# Patient Record
Sex: Male | Born: 1980 | Race: White | Hispanic: No | Marital: Married | Smoking: Never smoker
Health system: Southern US, Community
[De-identification: ages and names within clinical notes are randomized; demographics above are authoritative.]

## PROBLEM LIST (undated history)

## (undated) DIAGNOSIS — G4730 Sleep apnea, unspecified: Secondary | ICD-10-CM

## (undated) HISTORY — DX: Sleep apnea, unspecified: G47.30

---

## 2009-01-27 ENCOUNTER — Emergency Department: Payer: Self-pay | Admitting: Emergency Medicine

## 2012-04-11 ENCOUNTER — Ambulatory Visit: Payer: Self-pay | Admitting: Medical

## 2016-08-11 DIAGNOSIS — Z008 Encounter for other general examination: Secondary | ICD-10-CM | POA: Diagnosis not present

## 2016-08-12 DIAGNOSIS — Z0279 Encounter for issue of other medical certificate: Secondary | ICD-10-CM | POA: Diagnosis not present

## 2016-08-12 DIAGNOSIS — Z0000 Encounter for general adult medical examination without abnormal findings: Secondary | ICD-10-CM | POA: Diagnosis not present

## 2016-08-12 DIAGNOSIS — Z125 Encounter for screening for malignant neoplasm of prostate: Secondary | ICD-10-CM | POA: Diagnosis not present

## 2016-08-12 DIAGNOSIS — Z008 Encounter for other general examination: Secondary | ICD-10-CM | POA: Diagnosis not present

## 2017-01-15 ENCOUNTER — Ambulatory Visit (INDEPENDENT_AMBULATORY_CARE_PROVIDER_SITE_OTHER): Payer: 59

## 2017-01-15 ENCOUNTER — Ambulatory Visit
Admission: EM | Admit: 2017-01-15 | Discharge: 2017-01-15 | Disposition: A | Discharge status: Home / Self Care | Payer: 59 | Attending: Family Medicine | Admitting: Family Medicine

## 2017-01-15 DIAGNOSIS — S93412A Sprain of calcaneofibular ligament of left ankle, initial encounter: Secondary | ICD-10-CM

## 2017-01-15 DIAGNOSIS — W1842XA Slipping, tripping and stumbling without falling due to stepping into hole or opening, initial encounter: Secondary | ICD-10-CM

## 2017-01-15 DIAGNOSIS — M25572 Pain in left ankle and joints of left foot: Secondary | ICD-10-CM | POA: Diagnosis not present

## 2017-01-15 DIAGNOSIS — S99912A Unspecified injury of left ankle, initial encounter: Secondary | ICD-10-CM | POA: Diagnosis not present

## 2017-01-15 NOTE — ED Triage Notes (Signed)
Pt reports twisting his left ankle on Wednesday while stepping off a boat onto another. Pain 3/10. Wearing a brace in triage.

## 2017-01-15 NOTE — ED Provider Notes (Signed)
[facility]    CSN: [id number] Arrival date & time: 01/15/17  1156     History   Chief Complaint Chief Complaint  Patient presents with  . Ankle Pain    HPI Alan Duncan is a 36 y.o. male.   HPI   This a 36 year old male while on a cruise in the [place]. On Wednesday 2 days prior to this visit he was stepping into a small launch when he twisted his ankle and felt a pop. He states his left ankle twisted into inversion. Walked on it throughout the rest of the day on the cruise boat and then later in [place] before coming home. Since that time it has had swelling with ecchymosis over the inferior portion of the lateral malleolus. He purchased a brace which is helped with the discomfort with ambulation.        History reviewed. No pertinent past medical history.  There are no active problems to display for this patient.   History reviewed. No pertinent surgical history.     Home Medications    Prior to Admission medications   Not on File    Family History History reviewed. No pertinent family history.  Social History Social History  Substance Use Topics  . Smoking status: Never Smoker  . Smokeless tobacco: Never Used  . Alcohol use Yes     Comment: social     Allergies   Patient has no known allergies.   Review of Systems Review of Systems  Constitutional: Positive for activity change. Negative for appetite change, chills, fatigue and fever.  Musculoskeletal: Positive for gait problem and joint swelling.  All other systems reviewed and are negative.    Physical Exam Triage Vital Signs ED Triage Vitals [01/15/17 1211]  Enc Vitals Group     BP      Pulse      Resp      Temp      Temp src      SpO2      Weight (!) 310 lb (140.6 kg)     Height  (1.727 m)     Head Circumference      Peak Flow      Pain Score 3     Pain Loc      Pain Edu?      Excl. in GC?    No data found.   Updated Vital  Signs Ht  (1.727 m)   Wt (!) 310 lb (140.6 kg)   BMI 47.14 kg/m   Visual Acuity Right Eye Distance:   Left Eye Distance:   Bilateral Distance:    Right Eye Near:   Left Eye Near:    Bilateral Near:     Physical Exam  Constitutional: He is oriented to person, place, and time. He appears well-developed and well-nourished. No distress.  HENT:  Head: Normocephalic.  Eyes: Pupils are equal, round, and reactive to light.  Neck: Normal range of motion.  Musculoskeletal: Normal range of motion. He exhibits edema and tenderness.  Examination of the left ankle shows swelling maximally over the lateral aspect inferior to the malleolus. Inferior to the malleolus around the calcaneus is at area of ecchymosis. This is from extravasation. He has fairly good range of motion plantar flexion dorsiflexion and subtalar motion. Forced inversion  tends to hurt inferior to the lateral malleolus. Maximal tenderness is over the fibulocalcaneal ligament. He has  mild tenderness at the distal tip of the lateral malleolus.  He ambulates with a mild antalgic gait on the left.  Neurological: He is alert and oriented to person, place, and time.  Skin: Skin is warm and dry. He is not diaphoretic. There is erythema.  Psychiatric: He has a normal mood and affect. His behavior is normal. Judgment and thought content normal.  Nursing note and vitals reviewed.    UC Treatments / Results  Labs (all labs ordered are listed, but only abnormal results are displayed) Labs Reviewed - No data to display  EKG  EKG Interpretation None       Radiology Dg Ankle Complete Left  Result Date: 01/15/2017 CLINICAL DATA:  Acute left ankle pain following twisting injury 2 days ago. Initial encounter. EXAM: LEFT ANKLE COMPLETE - 3+ VIEW COMPARISON:  None. FINDINGS: There is no evidence of fracture, dislocation, or joint effusion. There is no evidence of arthropathy or other focal bone abnormality. Soft tissues are  unremarkable. IMPRESSION: Negative. Electronically Signed   By: Harmon Pier M.D.   On: 01/15/2017 12:38    Procedures Procedures (including critical care time)  Medications Ordered in UC Medications - No data to display   Initial Impression / Assessment and Plan / UC Course  I have reviewed the triage vital signs and the nursing notes.  Pertinent labs & imaging results that were available during my care of the patient were reviewed by me and considered in my medical decision making (see chart for details).    Plan: 1. Test/x-ray results and diagnosis reviewed with patient 2. rx as per orders; risks, benefits, potential side effects reviewed with patient 3. Recommend supportive treatment with elevation above the heart to control swelling. Use ice 20 minutes out of every 2 hours for 4- 5 times daily for swelling control and pain control. Perform range of motion exercises of the ankle while healing. If you're not improving follow-up with Dr. Ether Griffins podiatrist. Use Motrin or Tylenol for pain control. 4. F/u prn if symptoms worsen or don't improve     Final Clinical Impressions(s) / UC Diagnoses   Final diagnoses:  Sprain of calcaneofibular ligament of left ankle, initial encounter    New Prescriptions There are no discharge medications for this patient.    Controlled Substance Prescriptions [place] Controlled Substance Registry consulted? Not Applicable   Lutricia Feil, PA-C 01/15/17 1415

## 2017-01-15 NOTE — Discharge Instructions (Signed)
Elevated above your heart to control swelling. Use ice 20 minutes out of every 2 hours 4-5 times daily.

## 2017-05-14 ENCOUNTER — Ambulatory Visit
Admission: EM | Admit: 2017-05-14 | Discharge: 2017-05-14 | Disposition: A | Discharge status: Home / Self Care | Payer: 59 | Attending: Family Medicine | Admitting: Family Medicine

## 2017-05-14 ENCOUNTER — Other Ambulatory Visit: Payer: Self-pay

## 2017-05-14 ENCOUNTER — Encounter: Payer: Self-pay | Admitting: *Deleted

## 2017-05-14 DIAGNOSIS — H8111 Benign paroxysmal vertigo, right ear: Secondary | ICD-10-CM

## 2017-05-14 NOTE — ED Provider Notes (Signed)
[facility]    CSN: [id number] Arrival date & time: 05/14/17  1129     History   Chief Complaint Chief Complaint  Patient presents with  . Dizziness    HPI LUZ BURCHER is a 37 y.o. male.   The history is provided by the patient.  Dizziness  Quality:  Vertigo Severity:  Moderate Onset quality:  Sudden Duration:  12 months Timing:  Intermittent Progression:  Unchanged Chronicity:  Chronic Context: head movement   Relieved by:  None tried Ineffective treatments:  None tried Associated symptoms: no blood in stool, no chest pain, no diarrhea, no headaches, no hearing loss, no nausea, no palpitations, no shortness of breath, no syncope, no tinnitus, no vision changes, no vomiting and no weakness   Risk factors: no anemia, no heart disease, no hx of stroke, no hx of vertigo, no Meniere's disease, no multiple medications and no new medications     History reviewed. No pertinent past medical history.  There are no active problems to display for this patient.   History reviewed. No pertinent surgical history.     Home Medications    Prior to Admission medications   Not on File    Family History Family History  Problem Relation Age of Onset  . Other Mother     Social History Social History   Tobacco Use  . Smoking status: Never Smoker  . Smokeless tobacco: Never Used  Substance Use Topics  . Alcohol use: Yes    Comment: social  . Drug use: No     Allergies   Patient has no known allergies.   Review of Systems Review of Systems  HENT: Negative for hearing loss and tinnitus.   Respiratory: Negative for shortness of breath.   Cardiovascular: Negative for chest pain, palpitations and syncope.  Gastrointestinal: Negative for blood in stool, diarrhea, nausea and vomiting.  Neurological: Positive for dizziness. Negative for weakness and headaches.     Physical Exam Triage Vital Signs ED Triage Vitals  Enc Vitals Group     BP  05/14/17 1138 (!) 149/84     Pulse Rate 05/14/17 1138 61     Resp 05/14/17 1138 16     Temp 05/14/17 1138 97.9 F (36.6 C)     Temp Source 05/14/17 1138 Oral     SpO2 05/14/17 1138 97 %     Weight 05/14/17 1139 (!) 315 lb (142.9 kg)     Height 05/14/17 1139 5\' 8"  (1.727 m)     Head Circumference --      Peak Flow --      Pain Score 05/14/17 1139 0     Pain Loc --      Pain Edu? --      Excl. in GC? --    No data found.  Updated Vital Signs BP (!) 149/84 (BP Location: Right Arm)   Pulse 61   Temp 97.9 F (36.6 C) (Oral)   Resp 16   Ht 5\' 8"  (1.727 m)   Wt (!) 315 lb (142.9 kg)   SpO2 97%   BMI 47.90 kg/m   Visual Acuity Right Eye Distance:   Left Eye Distance:   Bilateral Distance:    Right Eye Near:   Left Eye Near:    Bilateral Near:     Physical Exam  Constitutional: He is oriented to person, place, and time. He appears well-developed and well-nourished. No distress.  HENT:  Head: Normocephalic and atraumatic.  Right Ear: Tympanic  membrane, external ear and ear canal normal.  Left Ear: Tympanic membrane, external ear and ear canal normal.  Nose: Nose normal.  Mouth/Throat: Uvula is midline, oropharynx is clear and moist and mucous membranes are normal. No oropharyngeal exudate or tonsillar abscesses.  Eyes: Conjunctivae and EOM are normal. Pupils are equal, round, and reactive to light. Right eye exhibits no discharge. Left eye exhibits no discharge. No scleral icterus.  Neck: Normal range of motion. Neck supple. No tracheal deviation present. No thyromegaly present.  Cardiovascular: Normal rate, regular rhythm and normal heart sounds.  Pulmonary/Chest: Effort normal and breath sounds normal. No stridor. No respiratory distress. He has no wheezes. He has no rales. He exhibits no tenderness.  Lymphadenopathy:    He has no cervical adenopathy.  Neurological: He is alert and oriented to person, place, and time. He displays normal reflexes. No cranial nerve deficit  or sensory deficit. He exhibits normal muscle tone. Coordination normal.  Positive Hallpike maneuver  Skin: Skin is warm and dry. No rash noted. He is not diaphoretic.  Nursing note and vitals reviewed.    UC Treatments / Results  Labs (all labs ordered are listed, but only abnormal results are displayed) Labs Reviewed - No data to display  EKG  EKG Interpretation None       Radiology No results found.  Procedures Procedures (including critical care time)  Medications Ordered in UC Medications - No data to display   Initial Impression / Assessment and Plan / UC Course  I have reviewed the triage vital signs and the nursing notes.  Pertinent labs & imaging results that were available during my care of the patient were reviewed by me and considered in my medical decision making (see chart for details).      Final Clinical Impressions(s) / UC Diagnoses   Final diagnoses:  Benign paroxysmal positional vertigo of right ear    ED Discharge Orders    None     1. diagnosis reviewed with patient 2. Recommend supportive treatment with vestibular home exercise (verbal and written information given)  3. Follow-up prn if symptoms worsen or don't improve  Controlled Substance Prescriptions [place] Controlled Substance Registry consulted? Not Applicable   Payton Mccallumonty, Krina Mraz, MD 05/14/17 [phone]

## 2017-05-14 NOTE — ED Triage Notes (Signed)
Patient started having symptoms of dizziness that started 1 week ago. Patient has had episodes of dizziness for 1 year.

## 2017-05-17 ENCOUNTER — Telehealth: Payer: Self-pay | Admitting: *Deleted

## 2017-05-17 NOTE — Telephone Encounter (Signed)
Called patient, verified DOB, patient reported feeling better. Advised patient to follow up with PCP if symptoms persist. 

## 2017-08-18 DIAGNOSIS — Z125 Encounter for screening for malignant neoplasm of prostate: Secondary | ICD-10-CM | POA: Diagnosis not present

## 2017-08-18 DIAGNOSIS — Z0000 Encounter for general adult medical examination without abnormal findings: Secondary | ICD-10-CM | POA: Diagnosis not present

## 2017-08-18 DIAGNOSIS — Z008 Encounter for other general examination: Secondary | ICD-10-CM | POA: Diagnosis not present

## 2017-10-06 ENCOUNTER — Ambulatory Visit
Admission: EM | Admit: 2017-10-06 | Discharge: 2017-10-06 | Disposition: A | Discharge status: Home / Self Care | Payer: 59 | Attending: Family Medicine | Admitting: Family Medicine

## 2017-10-06 ENCOUNTER — Encounter: Payer: Self-pay | Admitting: Emergency Medicine

## 2017-10-06 ENCOUNTER — Other Ambulatory Visit: Payer: Self-pay

## 2017-10-06 DIAGNOSIS — J029 Acute pharyngitis, unspecified: Secondary | ICD-10-CM | POA: Diagnosis not present

## 2017-10-06 DIAGNOSIS — R05 Cough: Secondary | ICD-10-CM | POA: Diagnosis not present

## 2017-10-06 DIAGNOSIS — B9789 Other viral agents as the cause of diseases classified elsewhere: Secondary | ICD-10-CM | POA: Diagnosis not present

## 2017-10-06 LAB — RAPID STREP SCREEN (MED CTR MEBANE ONLY): Streptococcus, Group A Screen (Direct): NEGATIVE

## 2017-10-06 MED ORDER — LIDOCAINE VISCOUS HCL 2 % MT SOLN: OROMUCOSAL | 0 refills | Status: DC

## 2017-10-06 MED ORDER — FLUTICASONE PROPIONATE 50 MCG/ACT NA SUSP: 2.0000 | Freq: Every day | NASAL | 0 refills | Status: DC

## 2017-10-06 NOTE — ED Provider Notes (Signed)
[facility]  CSN: [id number] Arrival date & time: 10/06/17  0834  History   Chief Complaint Chief Complaint  Patient presents with  . Sore Throat   HPI  37 year old male presents with sore throat.  Sore throat  X 1 week.  Hurts to swallow.  No reported sick contacts.  No medications or interventions tried.  No fever.  Coughs when he lies down. No other reported symptoms.  No known relieving factors.  No other complaints.  Social History Social History   Tobacco Use  . Smoking status: Never Smoker  . Smokeless tobacco: Never Used  Substance Use Topics  . Alcohol use: Yes    Comment: social  . Drug use: No   Allergies   Patient has no known allergies.  Review of Systems Review of Systems  Constitutional: Negative for fever.  HENT: Positive for sore throat.   Respiratory: Positive for cough.    Physical Exam Triage Vital Signs ED Triage Vitals  Enc Vitals Group     BP      Pulse      Resp      Temp      Temp src      SpO2      Weight      Height      Head Circumference      Peak Flow      Pain Score      Pain Loc      Pain Edu?      Excl. in GC?    Updated Vital Signs BP (!) 134/91 (BP Location: Right Arm)   Pulse 89   Temp 97.9 F (36.6 C) (Oral)   Resp 18   Ht 5\' 9"  (1.753 m)   Wt (!) 320 lb (145.2 kg)   SpO2 98%   BMI 47.26 kg/m  Physical Exam  Constitutional: He is oriented to person, place, and time. He appears well-developed. No distress.  HENT:  Head: Normocephalic and atraumatic.  Right Ear: Tympanic membrane normal.  Left Ear: Tympanic membrane normal.  Mouth/Throat: Uvula is midline. No oropharyngeal exudate.  Oropharynx with mild to moderate erythema.  Cardiovascular: Normal rate and regular rhythm.  Pulmonary/Chest: Effort normal and breath sounds normal. He has no wheezes. He has no rales.  Neurological: He is alert and oriented to person, place, and time.  Psychiatric: He has a normal mood and affect.  His behavior is normal.  Nursing note and vitals reviewed.  UC Treatments / Results  Labs (all labs ordered are listed, but only abnormal results are displayed) Labs Reviewed  RAPID STREP SCREEN ([facility] ONLY)  CULTURE, GROUP A STREP [facility])    EKG None  Radiology No results found.  Procedures Procedures (including critical care time)  Medications Ordered in UC Medications - No data to display  Initial Impression / Assessment and Plan / UC Course  I have reviewed the triage vital signs and the nursing notes.  Pertinent labs & imaging results that were available during my care of the patient were reviewed by me and considered in my medical decision making (see chart for details).    37 year old male presents with viral pharyngitis.  Strep negative.  Treating with Flonase, viscous lidocaine if needed.  Supportive care.  Final Clinical Impressions(s) / UC Diagnoses   Final diagnoses:  Viral pharyngitis     Discharge Instructions     Strep negative.  Medications as directed.  Take care  Dr. Adriana Simasook  ED Prescriptions    Medication Sig Dispense Auth. Provider   fluticasone (FLONASE) 50 MCG/ACT nasal spray Place 2 sprays into both nostrils daily. 16 g Mehar Kirkwood G, DO   lidocaine (XYLOCAINE) 2 % solution Gargle 15 mL every 3 hours as needed for sore throat. May swallow if desired. 100 mL Tommie Sams, DO     Controlled Substance Prescriptions [place] Controlled Substance Registry consulted? Not Applicable   Tommie Sams, [place] 10/06/17 7829

## 2017-10-06 NOTE — ED Triage Notes (Signed)
Patient c/o sore throat x 1 week. Denies fever. Positive for cough at night time.

## 2017-10-06 NOTE — Discharge Instructions (Signed)
Strep negative.  Medications as directed.  Take care  Dr. Adriana Simasook

## 2017-10-08 LAB — CULTURE, GROUP A STREP (THRC)

## 2018-06-24 ENCOUNTER — Encounter: Payer: Self-pay | Admitting: Family Medicine

## 2018-06-24 ENCOUNTER — Other Ambulatory Visit: Payer: Self-pay

## 2018-06-24 ENCOUNTER — Ambulatory Visit: Payer: 59 | Admitting: Family Medicine

## 2018-06-24 VITALS — BP 140/90 | HR 72 | Temp 97.9°F | Resp 16 | Ht 69.0 in | Wt 328.0 lb

## 2018-06-24 DIAGNOSIS — I10 Essential (primary) hypertension: Secondary | ICD-10-CM | POA: Diagnosis not present

## 2018-06-24 DIAGNOSIS — G4733 Obstructive sleep apnea (adult) (pediatric): Secondary | ICD-10-CM | POA: Diagnosis not present

## 2018-06-24 DIAGNOSIS — Z6841 Body Mass Index (BMI) 40.0 and over, adult: Secondary | ICD-10-CM

## 2018-06-24 DIAGNOSIS — Z7689 Persons encountering health services in other specified circumstances: Secondary | ICD-10-CM

## 2018-06-24 MED ORDER — AMLODIPINE BESYLATE 5 MG PO TABS
5.0000 mg | ORAL_TABLET | Freq: Every day | ORAL | 5 refills | Status: DC
Start: 2018-06-24 — End: 2018-11-25

## 2018-06-24 NOTE — Progress Notes (Signed)
Subjective:    Patient ID: Alan Duncan, male    DOB: Sep 13, 1980, 38 y.o.   MRN: [id number]  Alan Duncan is a 38 y.o. male presenting on 06/24/2018 for Establish Care (HTN, sleep apneavas per patient his ranges is 170/114, yesterday 160/105 mm/hg)  Establishing care as new patient, previously had seen Dr Elizabeth Sauer >10 years ago, also at [facility] in 2017.  HPI  CHRONIC HTN: Reports high BP for years - 5-10 years in past. Limited improvement from recent weight loss. He checks BP with co workers at [employer], recently 2 months has been >140/90 consistently. Current Meds - Never taken any medication for BP.   Lifestyle: - Weight loss about 30 mins in 2-3 months - Diet: reduced carb and low sugar, reduced caffeine (nearly quit in past 1 month) - Exercise: gym, cardio 3-4 days a week, pending schedule - No other substances, except very rare occasional alcohol 1 x week Denies CP, dyspnea, HA, edema, dizziness / lightheadedness  OSA - Patient reports prior history of dx OSA and on CPAP about 8 years ago had sleep study and had trial CPAP machine for 1 month,  Had difficulty wearing it and never really complied with it, he never picked up new machine. He was initially diagnosed because it was witnessed by his wife with apnea stopped breathing and waking up gasping. - Today reports that sleep apnea is uncontrolled. Does not have CPAP, interested for repeat PSG now.  Epworth Sleepiness Scale Total Score: 17 Sitting and reading - 2 Watching TV - 3 Sitting inactive in a public place - 3 As a passenger in a car for an hour without a break - 1 Lying down to rest in the afternoon when circumstances permit - 3 Sitting and talking to someone - 2 Sitting quietly after a lunch without alcohol - 2 In a car, while stopped for a few minutes in traffic - 1   STOP-Bang OSA scoring Snoring yes   Tiredness yes   Observed apneas yes   Pressure HTN yes   BMI > 35 kg/m2 yes    Age > 50  no   Neck (male >17 in; Male >16 in)  yes 81"  Gender male yes   OSA risk low (0-2)  OSA risk intermediate (3-4)  OSA risk high (5+)  Total: 7 High Risk     Additional social history: Lives in [place], works full time IT sales professional within city of [employer]:  No known early history of colon or prostate cancer screening. 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  Depression screen PHQ 2/9 06/24/2018  Decreased Interest 0  Down, Depressed, Hopeless 0  PHQ - 2 Score 0    Past Medical History:  Diagnosis Date  . Sleep apnea    History reviewed. No pertinent surgical history. Social History   Socioeconomic History  . Marital status: Single    Spouse name: Not on file  . Number of children: Not on file  . Years of education: Not on file  . Highest education level: Not on file  Occupational History  . Not on file  Social Needs  . Financial resource strain: Not on file  . Food insecurity:    Worry: Not on file    Inability: Not on file  . Transportation needs:    Medical: Not on file    Non-medical: Not on file  Tobacco Use  .  Smoking status: Never Smoker  . Smokeless tobacco: Never Used  Substance and Sexual Activity  . Alcohol use: Yes    Comment: social  . Drug use: No  . Sexual activity: Not on file  Lifestyle  . Physical activity:    Days per week: Not on file    Minutes per session: Not on file  . Stress: Not on file  Relationships  . Social connections:    Talks on phone: Not on file    Gets together: Not on file    Attends religious service: Not on file    Active member of club or organization: Not on file    Attends meetings of clubs or organizations: Not on file    Relationship status: Not on file  . Intimate partner violence:    Fear of current or ex partner: Not on file    Emotionally abused: Not on file    Physically abused: Not on file    Forced sexual activity:  Not on file  Other Topics Concern  . Not on file  Social History Narrative  . Not on file   Family History  Problem Relation Age of Onset  . Hypertension Father   . Heart disease Maternal Grandmother   . Heart disease Maternal Grandfather   . Kidney failure Maternal Grandfather   . Cancer Maternal Grandfather   . Hypertension Paternal Grandmother   . Prostate cancer Neg Hx   . Colon cancer Neg Hx    Current Outpatient Medications on File Prior to Visit  Medication Sig  . fluticasone (FLONASE) 50 MCG/ACT nasal spray Place 2 sprays into both nostrils daily.   No current facility-administered medications on file prior to visit.     Review of Systems Per HPI unless specifically indicated above      Objective:    BP 140/90 (BP Location: Left Arm, Cuff Size: Large)   Pulse 72   Temp 97.9 F (36.6 C) (Oral)   Resp 16   Ht  (1.753 m)   Wt (!) 328 lb (148.8 kg)   BMI 48.44 kg/m   Wt Readings from Last 3 Encounters:  06/24/18 (!) 328 lb (148.8 kg)  10/06/17 (!) 320 lb (145.2 kg)  05/14/17 (!) 315 lb (142.9 kg)    Physical Exam Vitals signs and nursing note reviewed.  Constitutional:      General: He is not in acute distress.    Appearance: He is well-developed. He is not diaphoretic.     Comments: Well-appearing, comfortable, cooperative, obese  HENT:     Head: Normocephalic and atraumatic.  Eyes:     General:        Right eye: No discharge.        Left eye: No discharge.     Conjunctiva/sclera: Conjunctivae normal.  Neck:     Musculoskeletal: Normal range of motion and neck supple.     Thyroid: No thyromegaly.  Cardiovascular:     Rate and Rhythm: Normal rate and regular rhythm.     Heart sounds: Normal heart sounds. No murmur.  Pulmonary:     Effort: Pulmonary effort is normal. No respiratory distress.     Breath sounds: Normal breath sounds. No wheezing or rales.  Musculoskeletal: Normal range of motion.  Lymphadenopathy:     Cervical: No cervical  adenopathy.  Skin:    General: Skin is warm and dry.     Findings: No erythema or rash.  Neurological:     Mental Status: He is  alert and oriented to person, place, and time.  Psychiatric:        Behavior: Behavior normal.     Comments: Well groomed, good eye contact, normal speech and thoughts    Results for orders placed or performed during the hospital encounter of 10/06/17  Rapid Strep Screen ([facility] & [facility] ONLY)  Result Value Ref Range   Streptococcus, Group A Screen (Direct) NEGATIVE NEGATIVE  Culture, group A strep  Result Value Ref Range   Specimen Description      THROAT Performed at [facility], [address], [place], [place] [address]    Special Requests      NONE Reflexed from [id number] Performed at [facility], [address] [place], [place] [address]    Culture      NO GROUP A STREP (S.PYOGENES) ISOLATED Performed at [facility], [address], [place], [place] [address]    Report Status 10/08/2017 FINAL       Assessment & Plan:   Problem List Items Addressed This Visit    Essential hypertension - Primary    Chronic problem, newly diagnosed, prior untreated Also secondary component w/ OSA untreated - Home BP readings elevated     Plan:  1. START Amlodipine 5mg  daily - discussed benefit and side effect 2. Encourage improved lifestyle - low sodium diet, regular exercise 3. Start monitor BP outside office, bring readings to next visit, if persistently >140/90 or new symptoms notify office sooner - ORDER PSG and treat underlying OSA In future 4. Follow-up 3 months - in future may adjust or titrate off Amlodipine if improve lifestyle wt loss and control OSA      Relevant Medications   amLODipine (NORVASC) 5 MG tablet   Morbid obesity with BMI of 45.0-49.9, adult (HCC)    Abnormal weight gain over past 1 year, has continued wt gain Encourage keep improving lifestyle diet exercise Future consider interventions with  medication vs referral if indicated      OSA (obstructive sleep apnea)    Previously diagnosed OSA, did not proceed with CPAP Persistent clinical concern for suspected obstructive sleep apnea given reported symptoms with witnessed apnea, snoring and sleep disturbance, fatigue excessive sleepiness. - Screening: ESS score 17 / STOP-Bang Score 7 = high risk - Neck Circumference: 18" - Co-morbidities: HTN  Plan: 1. Discussion on initial diagnosis and testing for OSA, risk factors, management, complications 2. Agree to proceed with sleep study testing - proceed with PSG vs CPAP Titration study, may do split night - [facility] to [facility]        Other Visit Diagnoses    Encounter to establish care with new doctor          Meds ordered this encounter  Medications  . amLODipine (NORVASC) 5 MG tablet    Sig: Take 1 tablet (5 mg total) by mouth daily.    Dispense:  30 tablet    Refill:  5    Follow up plan: Return in about 3 months (around 09/24/2018) for HTN, Weight, OSA.  [name], DO [facility] 06/24/2018, 10:40 AM

## 2018-06-24 NOTE — Patient Instructions (Addendum)
Thank you for coming to the office today.  Start new BP medication - Amlodipine 5mg  daily - same time every day in morning Check BP occasionally now to keep track of BP on new med  Keep up great work with lifestyle, no caffeine, and weight loss!  I will stay tuned for results from [facility] for your upcoming physical - please ask them about a Complete Chemistry Panel or Basic Metabolic Panel for KIDNEY FUNCTION (Creatinine).  We will refer you to [facility] for next step for sleep study  They will contact you at some point to schedule and make arrangements.  Treating sleep will help lower your BP as well!  Please schedule a Follow-up Appointment to: Return in about 3 months (around 09/24/2018) for HTN, Weight, OSA.  If you have any other questions or concerns, please feel free to call the office or send a message through MyChart. You may also schedule an earlier appointment if necessary.  Additionally, you may be receiving a survey about your experience at our office within a few days to 1 week by e-mail or mail. We value your feedback.  [name], DO [facility], [place]

## 2018-06-25 NOTE — Assessment & Plan Note (Addendum)
Chronic problem, newly diagnosed, prior untreated Also secondary component w/ OSA untreated - Home BP readings elevated     Plan:  1. START Amlodipine 5mg  daily - discussed benefit and side effect 2. Encourage improved lifestyle - low sodium diet, regular exercise 3. Start monitor BP outside office, bring readings to next visit, if persistently >140/90 or new symptoms notify office sooner - ORDER PSG and treat underlying OSA In future 4. Follow-up 3 months - in future may adjust or titrate off Amlodipine if improve lifestyle wt loss and control OSA

## 2018-06-25 NOTE — Assessment & Plan Note (Signed)
Abnormal weight gain over past 1 year, has continued wt gain Encourage keep improving lifestyle diet exercise Future consider interventions with medication vs referral if indicated

## 2018-06-25 NOTE — Assessment & Plan Note (Signed)
Previously diagnosed OSA, did not proceed with CPAP Persistent clinical concern for suspected obstructive sleep apnea given reported symptoms with witnessed apnea, snoring and sleep disturbance, fatigue excessive sleepiness. - Screening: ESS score 17 / STOP-Bang Score 7 = high risk - Neck Circumference: 18" - Co-morbidities: HTN  Plan: 1. Discussion on initial diagnosis and testing for OSA, risk factors, management, complications 2. Agree to proceed with sleep study testing - proceed with PSG vs CPAP Titration study, may do split night - fax order to [facility]

## 2018-09-28 DIAGNOSIS — G4733 Obstructive sleep apnea (adult) (pediatric): Secondary | ICD-10-CM | POA: Diagnosis not present

## 2018-10-28 DIAGNOSIS — G4733 Obstructive sleep apnea (adult) (pediatric): Secondary | ICD-10-CM | POA: Diagnosis not present

## 2018-11-07 DIAGNOSIS — G4733 Obstructive sleep apnea (adult) (pediatric): Secondary | ICD-10-CM | POA: Diagnosis not present

## 2018-11-25 ENCOUNTER — Other Ambulatory Visit: Payer: Self-pay | Admitting: Family Medicine

## 2018-11-25 DIAGNOSIS — I10 Essential (primary) hypertension: Secondary | ICD-10-CM

## 2018-11-25 MED ORDER — AMLODIPINE BESYLATE 5 MG PO TABS: 5.0000 mg | ORAL_TABLET | Freq: Every day | ORAL | 1 refills | Status: DC

## 2018-11-28 DIAGNOSIS — G4733 Obstructive sleep apnea (adult) (pediatric): Secondary | ICD-10-CM | POA: Diagnosis not present

## 2018-12-07 LAB — IRON,TIBC AND FERRITIN PANEL: Iron: 50

## 2018-12-07 LAB — URIC ACID
GGT: 12 — AB (ref 18–76)
LDH: 163
Uric Acid: 4.6

## 2018-12-07 LAB — TSH: TSH: 2.15 (ref 0.41–5.90)

## 2018-12-07 LAB — CBC AND DIFFERENTIAL
HCT: 42 (ref 41–53)
Hemoglobin: 13.8 (ref 13.5–17.5)
Neutrophils Absolute: 70
Platelets: 256 (ref 150–399)
WBC: 7.9

## 2018-12-07 LAB — LIPID PANEL
Cholesterol: 131 (ref 0–200)
HDL: 39 (ref 35–70)
Triglycerides: 82 (ref 40–160)

## 2018-12-07 LAB — BASIC METABOLIC PANEL
BUN: 13 (ref 4–21)
Chloride: 103 (ref 99–108)
Creatinine: 0.8 (ref 0.6–1.3)
Glucose: 87
Potassium: 4.7 (ref 3.4–5.3)
Sodium: 140 (ref 137–147)

## 2018-12-07 LAB — HEPATIC FUNCTION PANEL
ALT: 20 (ref 10–40)
AST: 16 (ref 14–40)
Alkaline Phosphatase: 69 (ref 25–125)
Bilirubin, Total: 0.3

## 2018-12-07 LAB — CBC: RBC: 5.16 — AB (ref 3.87–5.11)

## 2018-12-07 LAB — COMPREHENSIVE METABOLIC PANEL
Albumin: 4.6 (ref 3.5–5.0)
Calcium: 9.1 (ref 8.7–10.7)
GFR calc non Af Amer: 117
Globulin: 2.1

## 2018-12-29 DIAGNOSIS — G4733 Obstructive sleep apnea (adult) (pediatric): Secondary | ICD-10-CM | POA: Diagnosis not present

## 2019-01-28 DIAGNOSIS — G4733 Obstructive sleep apnea (adult) (pediatric): Secondary | ICD-10-CM | POA: Diagnosis not present

## 2019-02-28 DIAGNOSIS — G4733 Obstructive sleep apnea (adult) (pediatric): Secondary | ICD-10-CM | POA: Diagnosis not present

## 2019-03-13 DIAGNOSIS — I10 Essential (primary) hypertension: Secondary | ICD-10-CM | POA: Diagnosis not present

## 2019-03-13 DIAGNOSIS — G4733 Obstructive sleep apnea (adult) (pediatric): Secondary | ICD-10-CM | POA: Diagnosis not present

## 2019-03-30 DIAGNOSIS — G4733 Obstructive sleep apnea (adult) (pediatric): Secondary | ICD-10-CM | POA: Diagnosis not present

## 2019-04-30 DIAGNOSIS — G4733 Obstructive sleep apnea (adult) (pediatric): Secondary | ICD-10-CM | POA: Diagnosis not present

## 2019-05-31 DIAGNOSIS — G4733 Obstructive sleep apnea (adult) (pediatric): Secondary | ICD-10-CM | POA: Diagnosis not present

## 2019-06-23 ENCOUNTER — Other Ambulatory Visit: Payer: Self-pay | Admitting: Family Medicine

## 2019-06-23 DIAGNOSIS — I10 Essential (primary) hypertension: Secondary | ICD-10-CM

## 2019-09-20 ENCOUNTER — Other Ambulatory Visit: Payer: Self-pay | Admitting: Family Medicine

## 2019-09-20 DIAGNOSIS — I10 Essential (primary) hypertension: Secondary | ICD-10-CM

## 2019-09-20 NOTE — Telephone Encounter (Signed)
Requested medications are due for refill today? Yes  Requested medications are on active medication list?  Yes  Last Refill:   06/23/2019  # 90 with no refills  Future visit scheduled?  No   Notes to Clinic:  Medication failed Rx Refill protocol due to no valid encounter in the past 6 months.  Last visit was 06/24/2018.

## 2019-10-26 ENCOUNTER — Telehealth: Payer: Self-pay | Admitting: Family Medicine

## 2019-10-26 DIAGNOSIS — I10 Essential (primary) hypertension: Secondary | ICD-10-CM

## 2019-10-26 NOTE — Telephone Encounter (Signed)
Medication Refill - Medication: amLODipine (NORVASC) 5 MG tablet    Has the patient contacted their pharmacy? yes (Agent: If no, request that the patient contact the pharmacy for the refill.) (Agent: If yes, when and what did the pharmacy advise?)contact pcp  Preferred Pharmacy (with phone number or street name):  [id number] - [place], [place] - [address] [place] [address] AT [facility] [address] Phone:  [phone]  Fax:  [phone]       Agent: Please be advised that RX refills may take up to 3 business days. We ask that you follow-up with your pharmacy.

## 2019-10-26 NOTE — Telephone Encounter (Signed)
Requested  medications are  due for refill today Yes  Requested medications are on the active medication list (There is no signature for how to take med on this rx)  Last refill NA  Last visit March 2020  Future visit scheduled no  Notes to clinic failed protocol for no visit within 6 months and there are no instructions for use on the rx.

## 2019-10-27 NOTE — Telephone Encounter (Signed)
Declined Amlodipine refill. Over 1 year since last visit.  Needs office visit   It was a new medication in 06/2018 - Amlodipine 5mg . Cannot refill until he follows up to know if it was successful and what his BP is currently doing.  , DO [facility] 10/27/2019, 6:23 PM

## 2019-10-31 MED ORDER — AMLODIPINE BESYLATE 5 MG PO TABS: 5.0000 mg | ORAL_TABLET | Freq: Every day | ORAL | 0 refills | Status: DC

## 2019-10-31 NOTE — Telephone Encounter (Signed)
Pt has an appt schedule for 11/03/2019. Pt would like amlodipine 5 mg. Pt is out

## 2019-11-03 ENCOUNTER — Encounter: Payer: Self-pay | Admitting: Family Medicine

## 2019-11-03 ENCOUNTER — Other Ambulatory Visit: Payer: Self-pay | Admitting: Family Medicine

## 2019-11-03 ENCOUNTER — Ambulatory Visit (INDEPENDENT_AMBULATORY_CARE_PROVIDER_SITE_OTHER): Payer: BC Managed Care – PPO | Admitting: Family Medicine

## 2019-11-03 ENCOUNTER — Other Ambulatory Visit: Payer: Self-pay

## 2019-11-03 VITALS — BP 109/57 | HR 93 | Temp 97.3°F | Resp 16 | Ht 69.0 in | Wt 341.6 lb

## 2019-11-03 DIAGNOSIS — Z114 Encounter for screening for human immunodeficiency virus [HIV]: Secondary | ICD-10-CM

## 2019-11-03 DIAGNOSIS — Z1159 Encounter for screening for other viral diseases: Secondary | ICD-10-CM

## 2019-11-03 DIAGNOSIS — I10 Essential (primary) hypertension: Secondary | ICD-10-CM

## 2019-11-03 DIAGNOSIS — Z6841 Body Mass Index (BMI) 40.0 and over, adult: Secondary | ICD-10-CM

## 2019-11-03 DIAGNOSIS — Z9989 Dependence on other enabling machines and devices: Secondary | ICD-10-CM | POA: Diagnosis not present

## 2019-11-03 DIAGNOSIS — R7309 Other abnormal glucose: Secondary | ICD-10-CM

## 2019-11-03 DIAGNOSIS — Z0000 Encounter for general adult medical examination without abnormal findings: Secondary | ICD-10-CM

## 2019-11-03 DIAGNOSIS — G4733 Obstructive sleep apnea (adult) (pediatric): Secondary | ICD-10-CM

## 2019-11-03 NOTE — Patient Instructions (Addendum)
Thank you for coming to the office today.  Keep on Amlodipine 5mg  daily for now, likely it is helping control BP. Also CPAP machine is probably helping as well.  Goal to keep improving lifestyle diet / exercise weight loss.  DUE for FASTING BLOOD WORK (no food or drink after midnight before the lab appointment, only water or coffee without cream/sugar on the morning of)  SCHEDULE "Lab Only" visit in the morning at the clinic for lab draw in 3 WEEKS   - Make sure Lab Only appointment is at about 1 week before your next appointment, so that results will be available  For Lab Results, once available within 2-3 days of blood draw, you can can log in to MyChart online to view your results and a brief explanation. Also, we can discuss results at next follow-up visit.   Please schedule a Follow-up Appointment to: Return in about 3 weeks (around 11/24/2019) for Annual Physical.  If you have any other questions or concerns, please feel free to call the office or send a message through MyChart. You may also schedule an earlier appointment if necessary.  Additionally, you may be receiving a survey about your experience at our office within a few days to 1 week by e-mail or mail. We value your feedback.  01/24/2020, DO [facility], [facility]

## 2019-11-03 NOTE — Assessment & Plan Note (Signed)
Well controlled on Amlodipine and now on CPAP therapy for OSA Also secondary component w/ OSA - Home BP readings controlled    Plan:  1. Continue current Amlodipine 5mg  daily - he was given 90 day rx 3 days ago, no new refill yet, will add refills next visit 2. Encourage improved lifestyle - low sodium diet, regular exercise 3. Continue to monitor BP outside office, bring readings to next visit, if persistently >140/90 or new symptoms notify office sooner  Future consider trial off med if controlled w/ weight loss and CPAP

## 2019-11-03 NOTE — Progress Notes (Signed)
Subjective:    Patient ID: Alan Duncan, male    DOB: May 10, 1980, 39 y.o.   MRN: [id number]  Alan Duncan is a 39 y.o. male presenting on 11/03/2019 for Hypertension   HPI   CHRONIC HTN / Morbid Obesity BMI >50 Reports high BP for years - 5-10 years in past Prior readings in past >140/90 Last visit with me (initial new visit) 06/2018, initiated on therapy Amlodipine 5mg  daily, no prior meds before. He has done very well, controlled BP. Has checked at home/work. He has run out of med for 1 week then it was recently refilled now here to follow-up Current Meds - Amlodipine 5mg  daily Lifestyle: - Weight gain since last visit - Diet: improving diet again - Exercise: now more sedentary job at desk, less exercise - No other substances, except very rare occasional alcohol 1 x week Denies CP, dyspnea, HA, edema, dizziness / lightheadedness  OSA - Patient reports prior history of dx OSA and on CPAP about 8 years ago had sleep study and had trial CPAP but did not stick with therapy - Last visit with me 06/2018, we ordered PSG testing, [facility], has completed testing and CPAP titration - He currently uses CPAP machine since 10/2018, on APAP 8-16 last reading. - He is benefiting from therapy and using it every night.   Health Maintenance: Not up to date on COVID19 vaccine. No early family history of colon or prostate cancer.  Depression screen [facility] 2/9 11/03/2019 06/24/2018  Decreased Interest 0 0  Down, Depressed, Hopeless 0 0  PHQ - 2 Score 0 0    Social History   Tobacco Use  . Smoking status: Never Smoker  . Smokeless tobacco: Never Used  Vaping Use  . Vaping Use: Never used  Substance Use Topics  . Alcohol use: Yes    Comment: social  . Drug use: No   Family History  Problem Relation Age of Onset  . Hypertension Father   . Heart disease Maternal Grandmother   . Heart disease Maternal Grandfather   . Kidney failure Maternal Grandfather   . Cancer  Maternal Grandfather   . Hypertension Paternal Grandmother   . Prostate cancer Neg Hx   . Colon cancer Neg Hx      Review of Systems Per HPI unless specifically indicated above     Objective:    BP (!) 109/57   Pulse 93   Temp (!) 97.3 F (36.3 C) (Temporal)   Resp 16   Ht 5\' 9"  (1.753 m)   Wt (!) 341 lb 9.6 oz (154.9 kg)   SpO2 96%   BMI 50.45 kg/m   Wt Readings from Last 3 Encounters:  11/03/19 (!) 341 lb 9.6 oz (154.9 kg)  06/24/18 (!) 328 lb (148.8 kg)  10/06/17 (!) 320 lb (145.2 kg)    Physical Exam Vitals and nursing note reviewed.  Constitutional:      General: He is not in acute distress.    Appearance: He is well-developed. He is obese. He is not diaphoretic.     Comments: Well-appearing, comfortable, cooperative  HENT:     Head: Normocephalic and atraumatic.  Eyes:     General:        Right eye: No discharge.        Left eye: No discharge.     Conjunctiva/sclera: Conjunctivae normal.  Cardiovascular:     Rate and Rhythm: Normal rate.  Pulmonary:     Effort: Pulmonary effort is  normal.  Skin:    General: Skin is warm and dry.     Findings: No erythema or rash.  Neurological:     Mental Status: He is alert and oriented to person, place, and time.  Psychiatric:        Behavior: Behavior normal.     Comments: Well groomed, good eye contact, normal speech and thoughts           Assessment & Plan:   Problem List Items Addressed This Visit    OSA on CPAP    Well controlled, chronic OSA on CPAP, now about 1 year - Good adherence to CPAP nightly - Continue current CPAP therapy, patient seems to be benefiting from therapy       Morbid obesity with BMI of 50.0-59.9, adult (HCC)    Abnormal weight gain over past 1-2 year Encourage keep improving lifestyle diet exercise Future consider interventions with medication vs referral if indicated      Essential hypertension - Primary    Well controlled on Amlodipine and now on CPAP therapy for  OSA Also secondary component w/ OSA - Home BP readings controlled    Plan:  1. Continue current Amlodipine 5mg  daily - he was given 90 day rx 3 days ago, no new refill yet, will add refills next visit 2. Encourage improved lifestyle - low sodium diet, regular exercise 3. Continue to monitor BP outside office, bring readings to next visit, if persistently >140/90 or new symptoms notify office sooner  Future consider trial off med if controlled w/ weight loss and CPAP         No orders of the defined types were placed in this encounter.   Follow up plan: Return in about 3 weeks (around 11/24/2019) for Annual Physical.  Future labs ordered for 11/29/19 HIV, Hep C, TSH, CMET Lipid, A1c, CBC  01/29/20, DO [facility] 11/03/2019, 4:01 PM

## 2019-11-03 NOTE — Assessment & Plan Note (Signed)
Abnormal weight gain over past 1-2 year Encourage keep improving lifestyle diet exercise Future consider interventions with medication vs referral if indicated

## 2019-11-03 NOTE — Assessment & Plan Note (Signed)
Well controlled, chronic OSA on CPAP, now about 1 year - Good adherence to CPAP nightly - Continue current CPAP therapy, patient seems to be benefiting from therapy

## 2019-11-29 ENCOUNTER — Other Ambulatory Visit: Payer: BC Managed Care – PPO

## 2019-11-30 ENCOUNTER — Encounter: Payer: BC Managed Care – PPO | Admitting: Family Medicine

## 2019-12-06 ENCOUNTER — Encounter: Payer: BC Managed Care – PPO | Admitting: Family Medicine

## 2020-01-11 LAB — BASIC METABOLIC PANEL
BUN: 14 (ref 4–21)
Chloride: 97 — AB (ref 99–108)
Creatinine: 0.8 (ref 0.6–1.3)
Glucose: 83
Potassium: 4.2 (ref 3.4–5.3)
Sodium: 136 — AB (ref 137–147)

## 2020-01-11 LAB — LIPID PANEL
Cholesterol: 149 (ref 0–200)
HDL: 37 (ref 35–70)
LDL Cholesterol: 90
Triglycerides: 120 (ref 40–160)

## 2020-01-11 LAB — COMPREHENSIVE METABOLIC PANEL
Albumin: 4.5 (ref 3.5–5.0)
Calcium: 9.2 (ref 8.7–10.7)
GFR calc non Af Amer: 114
Globulin: 2.5

## 2020-01-11 LAB — URIC ACID
GGT: 25 (ref 18–76)
LDH: 187
Uric Acid: 4.9

## 2020-01-11 LAB — CBC AND DIFFERENTIAL
HCT: 44 (ref 41–53)
Hemoglobin: 14.5 (ref 13.5–17.5)
Platelets: 282 (ref 150–399)
WBC: 7.4

## 2020-01-11 LAB — HEPATIC FUNCTION PANEL
ALT: 56 — AB (ref 10–40)
AST: 27 (ref 14–40)
Alkaline Phosphatase: 65 (ref 25–125)
Bilirubin, Total: 0.3

## 2020-01-11 LAB — IRON,TIBC AND FERRITIN PANEL: Iron: 86

## 2020-01-11 LAB — TSH: TSH: 2.48 (ref 0.41–5.90)

## 2020-01-17 ENCOUNTER — Encounter: Payer: Self-pay | Admitting: Family Medicine

## 2020-01-17 ENCOUNTER — Other Ambulatory Visit: Payer: Self-pay

## 2020-01-17 ENCOUNTER — Telehealth (INDEPENDENT_AMBULATORY_CARE_PROVIDER_SITE_OTHER): Payer: BC Managed Care – PPO | Admitting: Family Medicine

## 2020-01-17 VITALS — BP 128/80 | Ht 69.0 in | Wt 341.0 lb

## 2020-01-17 DIAGNOSIS — I10 Essential (primary) hypertension: Secondary | ICD-10-CM | POA: Diagnosis not present

## 2020-01-17 DIAGNOSIS — N6002 Solitary cyst of left breast: Secondary | ICD-10-CM

## 2020-01-17 MED ORDER — AMLODIPINE BESYLATE 5 MG PO TABS: 5.0000 mg | ORAL_TABLET | Freq: Every day | ORAL | 3 refills | Status: DC

## 2020-01-17 NOTE — Assessment & Plan Note (Signed)
Well controlled on Amlodipine and on CPAP therapy for OSA Also secondary component w/ OSA - Home BP readings controlled    Plan:  1. Continue current Amlodipine 5mg  daily  Add refills - he agrees to send copy of last lab results from yearly biometric from employer 2. Encourage improved lifestyle - low sodium diet, regular exercise 3. Continue to monitor BP outside office, bring readings to next visit, if persistently >140/90 or new symptoms notify office sooner  Future consider trial off med if controlled w/ weight loss and CPAP

## 2020-01-17 NOTE — Progress Notes (Signed)
Virtual Visit via Telephone The purpose of this virtual visit is to provide medical care while limiting exposure to the novel coronavirus (COVID19) for both patient and office staff.  Consent was obtained for phone visit:  Yes.   Answered questions that patient had about telehealth interaction:  Yes.   I discussed the limitations, risks, security and privacy concerns of performing an evaluation and management service by telephone. I also discussed with the patient that there may be a patient responsible charge related to this service. The patient expressed understanding and agreed to proceed.  Patient Location: Home Provider Location: [facility] (Office)  *Note patient initially scheduled for MyChart video visit but unable to connect - changed to virtual telemedicine phone call*  ---------------------------------------------------------------------- Chief Complaint  Patient presents with  . Cyst    Left nipple onset 3 weeks    S: Reviewed CMA documentation. I have called patient and gathered additional HPI as follows:  Left Nipple Cyst Reports that symptoms started 3 weeks ago new onset, identified nodule or cyst near left nipple deeper in skin, not pustule or boil, no drainage of pus or redness. Admits mild tender to touch or pressure on it. No other known triggers (clothing, topicals, exposures, medicines) - No known fam history of breast cancer or similar problem - No prior history of similar issue - No other cyst or skin problem.  CHRONIC HTN Reportshigh BP for years - 5-10 years in past Last visit 10/2019 He had labs done through work biometric physical, will send copy to [place] Current Meds -Amlodipine 5mg  daily Lifestyle: - Diet:improving diet again Denies CP, dyspnea, HA, edema, dizziness / lightheadedness  Denies any fevers, chills, sweats, body ache, cough, shortness of breath, sinus pain or pressure, headache, abdominal pain, diarrhea  Past Medical  History:  Diagnosis Date  . Sleep apnea    Social History   Tobacco Use  . Smoking status: Never Smoker  . Smokeless tobacco: Never Used  Vaping Use  . Vaping Use: Never used  Substance Use Topics  . Alcohol use: Yes    Comment: social  . Drug use: No    Current Outpatient Medications:  .  amLODipine (NORVASC) 5 MG tablet, Take 1 tablet (5 mg total) by mouth daily., Disp: 90 tablet, Rfl: 3  Depression screen [facility] 2/9 11/03/2019 06/24/2018  Decreased Interest 0 0  Down, Depressed, Hopeless 0 0  PHQ - 2 Score 0 0    No flowsheet data found.  -------------------------------------------------------------------------- O: No physical exam performed due to remote telephone encounter.  Lab results reviewed.  No results found for this or any previous visit (from the past 2160 hour(s)).  -------------------------------------------------------------------------- A&P:  Problem List Items Addressed This Visit    Essential hypertension - Primary    Well controlled on Amlodipine and on CPAP therapy for OSA Also secondary component w/ OSA - Home BP readings controlled    Plan:  1. Continue current Amlodipine 5mg  daily  Add refills - he agrees to send 2161 copy of last lab results from yearly biometric from employer 2. Encourage improved lifestyle - low sodium diet, regular exercise 3. Continue to monitor BP outside office, bring readings to next visit, if persistently >140/90 or new symptoms notify office sooner  Future consider trial off med if controlled w/ weight loss and CPAP      Relevant Medications   amLODipine (NORVASC) 5 MG tablet    Other Visit Diagnoses    Breast cyst, left  Uncertain exact etiology, limited by virtual phone visit cannot examine the reported cyst History seems benign and now gradual improving, no other obvious cause Less likely abscess or infection based on his report He may observe for 2 more weeks, if after >4+ weeks total still worsening  or bothersome would recommend Breast US - he can contact us and we can order to schedule - otherwise if keeps improving, may follow up when needed - up to patient   Meds ordered this encounter  Medications  . amLODipine (NORVASC) 5 MG tablet    Sig: Take 1 tablet (5 mg total) by mouth daily.    Dispense:  90 tablet    Refill:  3    Follow-up: - Return as needed, notify us in 2 weeks if problem unresolved - Future can return yearly for HTN check up and med review, if he sends [place] his last labs done for his biometric  If he does not send lab results would need to draw labs sooner for future visits/refills.  Patient verbalizes understanding with the above medical recommendations including the limitation of remote medical advice.  Specific follow-up and call-back criteria were given for patient to follow-up or seek medical care more urgently if needed.   - Time spent in direct consultation with patient on phone: 10 minutes   [name], DO [facility] 01/17/2020, 11:45 AM

## 2020-01-24 DIAGNOSIS — Z20822 Contact with and (suspected) exposure to covid-19: Secondary | ICD-10-CM | POA: Diagnosis not present

## 2020-05-27 DIAGNOSIS — M79671 Pain in right foot: Secondary | ICD-10-CM | POA: Diagnosis not present

## 2020-05-27 DIAGNOSIS — M722 Plantar fascial fibromatosis: Secondary | ICD-10-CM | POA: Diagnosis not present

## 2020-05-29 ENCOUNTER — Encounter: Payer: Self-pay | Admitting: Family Medicine

## 2020-05-29 ENCOUNTER — Other Ambulatory Visit: Payer: Self-pay

## 2020-05-29 ENCOUNTER — Ambulatory Visit (INDEPENDENT_AMBULATORY_CARE_PROVIDER_SITE_OTHER): Payer: BC Managed Care – PPO | Admitting: Family Medicine

## 2020-05-29 VITALS — BP 123/68 | HR 70 | Ht 69.0 in | Wt 325.2 lb

## 2020-05-29 DIAGNOSIS — N63 Unspecified lump in unspecified breast: Secondary | ICD-10-CM

## 2020-05-29 DIAGNOSIS — N644 Mastodynia: Secondary | ICD-10-CM

## 2020-05-29 NOTE — Patient Instructions (Addendum)
Thank you for coming to the office today.  Call the Imaging Center below anytime to schedule your own appointment now that order has been placed.  [facility] [address] [place], [place] [address] Phone: [phone]  We may refer to General Surgery specialist who manage breast issues like this if there is a problem on the ultrasound image or pain does not improve.  ------  [facility] Address: [address], [place], [place] [address] Phone: [phone]  Regardless, we will likely refer you to a General Surgeon who specialists in breast issues and can provide more information.   Please schedule a Follow-up Appointment to: Return if symptoms worsen or fail to improve.  If you have any other questions or concerns, please feel free to call the office or send a message through MyChart. You may also schedule an earlier appointment if necessary.  Additionally, you may be receiving a survey about your experience at our office within a few days to 1 week by e-mail or mail. We value your feedback.  [name], DO [facility], [place]

## 2020-05-29 NOTE — Progress Notes (Signed)
Subjective:    Patient ID: Alan Duncan, male    DOB: 1980/08/23, 40 y.o.   MRN: [id number]  Alan Duncan is a 40 y.o. male presenting on 05/29/2020 for Mass (Left nipple. Been there since October of 2021. Not getting better )   HPI   Follow-up Left Nipple Cyst / Pain  - Last visit with me 01/17/20, for initial visit telemedicine same problem Left nipple cyst / pain treated with observation, see prior notes for background information. - Interval update with no interventions, but seems to be persistent now >6 months - Today patient reports still having similar LEFT nipple pain since onset 6 months ago, still sore to the touch but at rest not bothering, not triggered by clothing rubbing against it or other things. He has denied any pustule or sign of redness or drainage. He now admits some slight change in the appearance of the Left nipple - No known fam history of breast cancer or similar problem - No prior history of similar issue - No other cyst or skin problem.   Depression screen [facility] 2/9 11/03/2019 06/24/2018  Decreased Interest 0 0  Down, Depressed, Hopeless 0 0  PHQ - 2 Score 0 0    Social History   Tobacco Use  . Smoking status: Never Smoker  . Smokeless tobacco: Never Used  Vaping Use  . Vaping Use: Never used  Substance Use Topics  . Alcohol use: Yes    Comment: social  . Drug use: No    Review of Systems Per HPI unless specifically indicated above     Objective:    BP 123/68   Pulse 70   Ht 5\' 9"  (1.753 m)   Wt (!) 325 lb 3.2 oz (147.5 kg)   SpO2 99%   BMI 48.02 kg/m   Wt Readings from Last 3 Encounters:  05/29/20 (!) 325 lb 3.2 oz (147.5 kg)  01/17/20 (!) 341 lb (154.7 kg)  11/03/19 (!) 341 lb 9.6 oz (154.9 kg)    Physical Exam Vitals and nursing note reviewed.  Constitutional:      General: He is not in acute distress.    Appearance: He is well-developed and well-nourished. He is not diaphoretic.     Comments: Well-appearing, comfortable,  cooperative  HENT:     Head: Normocephalic and atraumatic.     Mouth/Throat:     Mouth: Oropharynx is clear and moist.  Eyes:     General:        Right eye: No discharge.        Left eye: No discharge.     Conjunctiva/sclera: Conjunctivae normal.  Cardiovascular:     Rate and Rhythm: Normal rate.  Pulmonary:     Effort: Pulmonary effort is normal.  Chest:    Musculoskeletal:        General: No edema.  Skin:    General: Skin is warm and dry.     Findings: No erythema or rash.  Neurological:     Mental Status: He is alert and oriented to person, place, and time.  Psychiatric:        Mood and Affect: Mood and affect normal.        Behavior: Behavior normal.     Comments: Well groomed, good eye contact, normal speech and thoughts    Results for orders placed or performed in visit on 01/22/20  Iron, TIBC and Ferritin Panel  Result Value Ref Range   Iron 50   CBC and differential  Result Value Ref Range   Hemoglobin 13.8 13.5 - 17.5   HCT 42 41 - 53   Neutrophils Absolute 70    Platelets 256 150 - 399   WBC 7.9   CBC  Result Value Ref Range   RBC 5.16 (A) 3.87 - 5.11  Basic metabolic panel  Result Value Ref Range   Glucose 87    BUN 13 4 - 21   Creatinine 0.8 0.6 - 1.3   Potassium 4.7 3.4 - 5.3   Sodium 140 137 - 147   Chloride 103 99 - 108  Comprehensive metabolic panel  Result Value Ref Range   Globulin 2.1    GFR calc non Af Amer 117    Calcium 9.1 8.7 - 10.7   Albumin 4.6 3.5 - 5.0  Lipid panel  Result Value Ref Range   Triglycerides 82 40 - 160   Cholesterol 131 0 - 200   HDL 39 35 - 70  Hepatic function panel  Result Value Ref Range   Alkaline Phosphatase 69 25 - 125   ALT 20 10 - 40   AST 16 14 - 40   Bilirubin, Total 0.3   TSH  Result Value Ref Range   TSH 2.15 0.41 - 5.90  Uric acid  Result Value Ref Range   Uric Acid 4.6    GGT 12 (A) 18 - 76   LDH 163   Uric acid  Result Value Ref Range   Uric Acid 4.9    LDH 187    GGT 25 18 - 76   Iron, TIBC and Ferritin Panel  Result Value Ref Range   Iron 86   CBC and differential  Result Value Ref Range   Hemoglobin 14.5 13.5 - 17.5   HCT 44 41 - 53   Platelets 282 150 - 399   WBC 7.4   Basic metabolic panel  Result Value Ref Range   Glucose 83    BUN 14 4 - 21   Creatinine 0.8 0.6 - 1.3   Potassium 4.2 3.4 - 5.3   Sodium 136 (A) 137 - 147   Chloride 97 (A) 99 - 108  Comprehensive metabolic panel  Result Value Ref Range   Globulin 2.5    GFR calc non Af Amer 114    Calcium 9.2 8.7 - 10.7   Albumin 4.5 3.5 - 5.0  Lipid panel  Result Value Ref Range   Triglycerides 120 40 - 160   Cholesterol 149 0 - 200   HDL 37 35 - 70   LDL Cholesterol 90   Hepatic function panel  Result Value Ref Range   Alkaline Phosphatase 65 25 - 125   ALT 56 (A) 10 - 40   AST 27 14 - 40   Bilirubin, Total 0.3   TSH  Result Value Ref Range   TSH 2.48 0.41 - 5.90      Assessment & Plan:   Problem List Items Addressed This Visit   None   Visit Diagnoses    Breast pain, left    -  Primary   Relevant Orders   US BREAST LTD UNI LEFT INC AXILLA   Breast nodule       Relevant Orders   US BREAST LTD UNI LEFT INC AXILLA      Unresolved, L breast nodular density now >6 months Assoc tenderness, no other skin or other symptoms. No fam history breast cancer or related issue Order left breast ultrasound, [place]  [facility], patient to schedule, notify us if need assistance Future will anticipate refer to Gen Surgery if result is abnormal or if result is normal given duration >6 month patient may want to speak with them as well for further cause of breast pain. Anticipate may refer to [facility] pending result of ultrasound  Orders Placed This Encounter  Procedures  . US BREAST LTD UNI LEFT INC AXILLA    Standing Status:   Future    Standing Expiration Date:   11/26/2020    Order Specific Question:   Reason for Exam (SYMPTOM  OR DIAGNOSIS REQUIRED)    Answer:   left breast 11 o  clock near nipple with nodular density and pain 6 months    Order Specific Question:   Preferred imaging location?    Answer:   [facility]      No orders of the defined types were placed in this encounter.     Follow up plan: Return if symptoms worsen or fail to improve.   [name], DO [facility] 05/29/2020, 10:09 AM

## 2020-05-31 ENCOUNTER — Other Ambulatory Visit: Payer: Self-pay | Admitting: Family Medicine

## 2020-05-31 DIAGNOSIS — N644 Mastodynia: Secondary | ICD-10-CM

## 2020-05-31 DIAGNOSIS — N63 Unspecified lump in unspecified breast: Secondary | ICD-10-CM

## 2020-05-31 NOTE — Addendum Note (Signed)
Addended by: Smitty Cords on: 05/31/2020 11:04 AM   Modules accepted: Orders

## 2020-06-07 ENCOUNTER — Other Ambulatory Visit: Payer: Self-pay

## 2020-06-07 ENCOUNTER — Ambulatory Visit
Admission: RE | Admit: 2020-06-07 | Discharge: 2020-06-07 | Disposition: A | Discharge status: Home / Self Care | Payer: BC Managed Care – PPO | Source: Ambulatory Visit | Attending: Family Medicine | Admitting: Family Medicine

## 2020-06-07 DIAGNOSIS — N644 Mastodynia: Secondary | ICD-10-CM

## 2020-06-07 DIAGNOSIS — N62 Hypertrophy of breast: Secondary | ICD-10-CM | POA: Diagnosis not present

## 2020-06-07 DIAGNOSIS — N63 Unspecified lump in unspecified breast: Secondary | ICD-10-CM | POA: Insufficient documentation

## 2020-06-07 DIAGNOSIS — R922 Inconclusive mammogram: Secondary | ICD-10-CM | POA: Diagnosis not present

## 2020-06-17 DIAGNOSIS — M722 Plantar fascial fibromatosis: Secondary | ICD-10-CM | POA: Diagnosis not present

## 2020-06-17 DIAGNOSIS — M79671 Pain in right foot: Secondary | ICD-10-CM | POA: Diagnosis not present

## 2021-01-04 ENCOUNTER — Other Ambulatory Visit: Payer: Self-pay | Admitting: Family Medicine

## 2021-01-04 DIAGNOSIS — I10 Essential (primary) hypertension: Secondary | ICD-10-CM

## 2021-01-04 NOTE — Telephone Encounter (Signed)
Requested medication (s) are due for refill today: yes  Requested medication (s) are on the active medication list: yes  Last refill:  01/17/20  Future visit scheduled: no  Notes to clinic:  Message left for pt to call office and schedule appt   Requested Prescriptions  Pending Prescriptions Disp Refills   amLODipine (NORVASC) 5 MG tablet [Pharmacy Med Name: AMLODIPINE BESYLATE 5MG  TABLETS] 90 tablet 3    Sig: TAKE 1 TABLET(5 MG) BY MOUTH DAILY     Cardiovascular:  Calcium Channel Blockers Failed - 01/04/2021  8:00 AM      Failed - Valid encounter within last 6 months    Recent Outpatient Visits           7 months ago Breast pain, left   [facility], DO   11 months ago Essential hypertension   [facility], DO   1 year ago Essential hypertension   [facility] [place], [place]   2 years ago Essential hypertension   [facility], DO              Passed - Last BP in normal range    BP Readings from Last 1 Encounters:  05/29/20 123/68

## 2021-05-04 ENCOUNTER — Other Ambulatory Visit: Payer: Self-pay | Admitting: Family Medicine

## 2021-05-04 DIAGNOSIS — I10 Essential (primary) hypertension: Secondary | ICD-10-CM

## 2021-05-04 NOTE — Telephone Encounter (Signed)
Requested Prescriptions  Pending Prescriptions Disp Refills   amLODipine (NORVASC) 5 MG tablet [Pharmacy Med Name: AMLODIPINE BESYLATE 5MG  TABLETS] 5 tablet 0    Sig: TAKE 1 TABLET(5 MG) BY MOUTH DAILY     Cardiovascular:  Calcium Channel Blockers Failed - 05/04/2021  3:39 AM      Failed - Valid encounter within last 6 months    Recent Outpatient Visits          11 months ago Breast pain, left   [facility], DO   1 year ago Essential hypertension   [facility], DO   1 year ago Essential hypertension   [facility], DO   2 years ago Essential hypertension   [facility], Althea Charon, DO      Future Appointments            In 3 days Netta Neat, Althea Charon, DO [facility]           Passed - Last BP in normal range    BP Readings from Last 1 Encounters:  05/29/20 123/68

## 2021-05-06 ENCOUNTER — Other Ambulatory Visit: Payer: Self-pay | Admitting: Family Medicine

## 2021-05-06 DIAGNOSIS — I10 Essential (primary) hypertension: Secondary | ICD-10-CM

## 2021-05-06 NOTE — Telephone Encounter (Signed)
Requested medications are due for refill today.  unsure  Requested medications are on the active medications list.  yes  Last refill. 05/04/2021  Future visit scheduled.   yes  Notes to clinic.  Refill was given on 05/04/2021 to last until pt's appointment on 05/07/2021.    Requested Prescriptions  Pending Prescriptions Disp Refills   amLODipine (NORVASC) 5 MG tablet [Pharmacy Med Name: AMLODIPINE BESYLATE 5MG  TABLETS] 5 tablet 0    Sig: TAKE 1 TABLET(5 MG) BY MOUTH DAILY     Cardiovascular:  Calcium Channel Blockers Failed - 05/06/2021  3:36 AM      Failed - Valid encounter within last 6 months    Recent Outpatient Visits           11 months ago Breast pain, left   [place], Devonne Doughty, DO   1 year ago Essential hypertension   [place], Devonne Doughty, DO   1 year ago Essential hypertension   [facility] Olin Hauser, DO   2 years ago Essential hypertension   [place], DO       Future Appointments             Tomorrow Olin Hauser, DO [facility] [place] BP in normal range    BP Readings from Last 1 Encounters:  05/29/20 123/68

## 2021-05-07 ENCOUNTER — Other Ambulatory Visit: Payer: Self-pay | Admitting: Family Medicine

## 2021-05-07 ENCOUNTER — Other Ambulatory Visit: Payer: Self-pay

## 2021-05-07 ENCOUNTER — Ambulatory Visit: Payer: BC Managed Care – PPO | Admitting: Family Medicine

## 2021-05-07 ENCOUNTER — Encounter: Payer: Self-pay | Admitting: Family Medicine

## 2021-05-07 VITALS — BP 134/84 | HR 76 | Ht 69.0 in | Wt 357.0 lb

## 2021-05-07 DIAGNOSIS — Z23 Encounter for immunization: Secondary | ICD-10-CM

## 2021-05-07 DIAGNOSIS — Z1159 Encounter for screening for other viral diseases: Secondary | ICD-10-CM

## 2021-05-07 DIAGNOSIS — Z13220 Encounter for screening for lipoid disorders: Secondary | ICD-10-CM

## 2021-05-07 DIAGNOSIS — Z0000 Encounter for general adult medical examination without abnormal findings: Secondary | ICD-10-CM | POA: Diagnosis not present

## 2021-05-07 DIAGNOSIS — I10 Essential (primary) hypertension: Secondary | ICD-10-CM

## 2021-05-07 DIAGNOSIS — Z6841 Body Mass Index (BMI) 40.0 and over, adult: Secondary | ICD-10-CM

## 2021-05-07 DIAGNOSIS — E559 Vitamin D deficiency, unspecified: Secondary | ICD-10-CM

## 2021-05-07 DIAGNOSIS — E291 Testicular hypofunction: Secondary | ICD-10-CM | POA: Diagnosis not present

## 2021-05-07 DIAGNOSIS — Z131 Encounter for screening for diabetes mellitus: Secondary | ICD-10-CM

## 2021-05-07 DIAGNOSIS — G4733 Obstructive sleep apnea (adult) (pediatric): Secondary | ICD-10-CM

## 2021-05-07 DIAGNOSIS — R600 Localized edema: Secondary | ICD-10-CM

## 2021-05-07 DIAGNOSIS — Z9989 Dependence on other enabling machines and devices: Secondary | ICD-10-CM

## 2021-05-07 MED ORDER — HYDROCHLOROTHIAZIDE 25 MG PO TABS: 25.0000 mg | ORAL_TABLET | Freq: Every day | ORAL | 3 refills | Status: DC

## 2021-05-07 NOTE — Progress Notes (Signed)
Subjective:    Patient ID: Alan Duncan, male    DOB: 1980-09-10, 41 y.o.   MRN: [id number]  Alan Duncan is a 41 y.o. male presenting on 05/07/2021 for Hypertension   HPI  Here for Annual Physical and Fasting Lab.  CHRONIC HTN Reports high BP for years - 5-10 years in past BP trend at home, Home readings 130/80, highest 144/84. He had labs done through work biometric physical, will send copy to [place] Current Meds - Amlodipine 5mg  daily He admits side effect swelling edema worse in evening in legs. Lifestyle: - Diet: improving diet again Denies CP, dyspnea, HA, dizziness / lightheadedness  OSA, on CPAP - Patient reports prior history of dx OSA and on CPAP for years, prior to treatment initial symptoms were snoring, daytime sleepiness and fatigue, has had several sleep studies in the past. - Today reports that sleep apnea is well controlled. He uses the CPAP machine every night. Tolerates the machine well, and thinks that sleeps better with it and feels good. No new concerns or symptoms.  Chronic Cough Now 2+ weeks without any etiology or cause, worse at night laying down.   Health Maintenance: Tdap vaccine today  Depression screen [facility] 2/9 05/07/2021 11/03/2019 06/24/2018  Decreased Interest 0 0 0  Down, Depressed, Hopeless 0 0 0  PHQ - 2 Score 0 0 0  Altered sleeping 0 - -  Tired, decreased energy 2 - -  Change in appetite 1 - -  Feeling bad or failure about yourself  0 - -  Trouble concentrating 0 - -  Moving slowly or fidgety/restless 0 - -  Suicidal thoughts 0 - -  PHQ-9 Score 3 - -  Difficult doing work/chores Not difficult at all - -    Past Medical History:  Diagnosis Date   Sleep apnea    History reviewed. No pertinent surgical history. Social History   Socioeconomic History   Marital status: Married    Spouse name: Not on file   Number of children: Not on file   Years of education: Not on file   Highest education level: Not on file  Occupational  History   Not on file  Tobacco Use   Smoking status: Never   Smokeless tobacco: Never  Vaping Use   Vaping Use: Never used  Substance and Sexual Activity   Alcohol use: Yes    Comment: social   Drug use: No   Sexual activity: Not on file  Other Topics Concern   Not on file  Social History Narrative   Not on file   Social Determinants of Health   Financial Resource Strain: Not on file  Food Insecurity: Not on file  Transportation Needs: Not on file  Physical Activity: Not on file  Stress: Not on file  Social Connections: Not on file  Intimate Partner Violence: Not on file   Family History  Problem Relation Age of Onset   Hypertension Father    Heart disease Maternal Grandmother    Heart disease Maternal Grandfather    Kidney failure Maternal Grandfather    Cancer Maternal Grandfather    Hypertension Paternal Grandmother    Prostate cancer Neg Hx    Colon cancer Neg Hx    Breast cancer Neg Hx    No current outpatient medications on file prior to visit.   No current facility-administered medications on file prior to visit.    Review of Systems  Constitutional:  Negative for activity change, appetite change, chills,  diaphoresis, fatigue and fever.  HENT:  Negative for congestion and hearing loss.   Eyes:  Negative for visual disturbance.  Respiratory:  Positive for cough. Negative for chest tightness, shortness of breath and wheezing.   Cardiovascular:  Negative for chest pain, palpitations and leg swelling.  Gastrointestinal:  Negative for abdominal pain, constipation, diarrhea, nausea and vomiting.  Genitourinary:  Negative for dysuria, frequency and hematuria.  Musculoskeletal:  Negative for arthralgias and neck pain.  Skin:  Negative for rash.  Neurological:  Negative for dizziness, weakness, light-headedness, numbness and headaches.  Hematological:  Negative for adenopathy.  Psychiatric/Behavioral:  Negative for behavioral problems, dysphoric mood and sleep  disturbance.   Per HPI unless specifically indicated above      Objective:    BP 134/84 (BP Location: Left Arm, Cuff Size: Normal)    Pulse 76    Ht 5\' 9"  (1.753 m)    Wt (!) 357 lb (161.9 kg)    SpO2 96%    BMI 52.72 kg/m   Wt Readings from Last 3 Encounters:  05/07/21 (!) 357 lb (161.9 kg)  05/29/20 (!) 325 lb 3.2 oz (147.5 kg)  01/17/20 (!) 341 lb (154.7 kg)    Physical Exam Vitals and nursing note reviewed.  Constitutional:      General: He is not in acute distress.    Appearance: He is well-developed. He is not diaphoretic.     Comments: Well-appearing, comfortable, cooperative  HENT:     Head: Normocephalic and atraumatic.  Eyes:     General:        Right eye: No discharge.        Left eye: No discharge.     Conjunctiva/sclera: Conjunctivae normal.     Pupils: Pupils are equal, round, and reactive to light.  Neck:     Thyroid: No thyromegaly.  Cardiovascular:     Rate and Rhythm: Normal rate and regular rhythm.     Pulses: Normal pulses.     Heart sounds: Normal heart sounds. No murmur heard. Pulmonary:     Effort: Pulmonary effort is normal. No respiratory distress.     Breath sounds: Normal breath sounds. No wheezing or rales.  Abdominal:     General: Bowel sounds are normal. There is no distension.     Palpations: Abdomen is soft. There is no mass.     Tenderness: There is no abdominal tenderness.  Musculoskeletal:        General: No tenderness. Normal range of motion.     Cervical back: Normal range of motion and neck supple.     Comments: Upper / Lower Extremities: - Normal muscle tone, strength bilateral upper extremities 5/5, lower extremities 5/5  Lymphadenopathy:     Cervical: No cervical adenopathy.  Skin:    General: Skin is warm and dry.     Findings: No erythema or rash.  Neurological:     Mental Status: He is alert and oriented to person, place, and time.     Comments: Distal sensation intact to light touch all extremities  Psychiatric:         Mood and Affect: Mood normal.        Behavior: Behavior normal.        Thought Content: Thought content normal.     Comments: Well groomed, good eye contact, normal speech and thoughts     Results for orders placed or performed in visit on 01/22/20  Iron, TIBC and Ferritin Panel  Result Value Ref Range  Iron 50   CBC and differential  Result Value Ref Range   Hemoglobin 13.8 13.5 - 17.5   HCT 42 41 - 53   Neutrophils Absolute 70    Platelets 256 150 - 399   WBC 7.9   CBC  Result Value Ref Range   RBC 5.16 (A) 3.87 - [id number]  Basic metabolic panel  Result Value Ref Range   Glucose 87    BUN 13 4 - 21   Creatinine 0.8 0.6 - 1.3   Potassium 4.7 3.4 - 5.3   Sodium 140 137 - 147   Chloride 103 99 - 108  Comprehensive metabolic panel  Result Value Ref Range   Globulin 2.1    GFR calc non Af Amer 117    Calcium 9.1 8.7 - 10.7   Albumin 4.6 3.5 - 5.0  Lipid panel  Result Value Ref Range   Triglycerides 82 40 - 160   Cholesterol 131 0 - 200   HDL 39 35 - 70  Hepatic function panel  Result Value Ref Range   Alkaline Phosphatase 69 25 - 125   ALT 20 10 - 40   AST 16 14 - 40   Bilirubin, Total 0.3   TSH  Result Value Ref Range   TSH 2.15 0.41 - 5.90  Uric acid  Result Value Ref Range   Uric Acid 4.6    GGT 12 (A) 18 - 76   LDH 163   Uric acid  Result Value Ref Range   Uric Acid 4.9    LDH 187    GGT 25 18 - 76  Iron, TIBC and Ferritin Panel  Result Value Ref Range   Iron 86   CBC and differential  Result Value Ref Range   Hemoglobin 14.5 13.5 - 17.5   HCT 44 41 - 53   Platelets 282 150 - 399   WBC 7.4   Basic metabolic panel  Result Value Ref Range   Glucose 83    BUN 14 4 - 21   Creatinine 0.8 0.6 - 1.3   Potassium 4.2 3.4 - 5.3   Sodium 136 (A) 137 - 147   Chloride 97 (A) 99 - 108  Comprehensive metabolic panel  Result Value Ref Range   Globulin 2.5    GFR calc non Af Amer 114    Calcium 9.2 8.7 - 10.7   Albumin 4.5 3.5 - 5.0  Lipid panel   Result Value Ref Range   Triglycerides 120 40 - 160   Cholesterol 149 0 - 200   HDL 37 35 - 70   LDL Cholesterol 90   Hepatic function panel  Result Value Ref Range   Alkaline Phosphatase 65 25 - 125   ALT 56 (A) 10 - 40   AST 27 14 - 40   Bilirubin, Total 0.3   TSH  Result Value Ref Range   TSH 2.48 0.41 - 5.90      Assessment & Plan:   Problem List Items Addressed This Visit     OSA on CPAP    Well controlled, chronic OSA on CPAP - Good adherence to CPAP nightly - Continue current CPAP therapy, patient seems to be benefiting from therapy       Morbid obesity with BMI of 50.0-59.9, adult (HCC)    Abnormal weight gain over past few years, may be some inc fluid retention edema weight Encourage keep improving lifestyle diet exercise Future consider interventions with medication vs referral  if indicated      Relevant Orders   TSH   Essential hypertension    Elevated BP Side effect on Amlodipine swelling Also secondary component w/ OSA - Home BP readings controlled    Plan:  1. DC Amlodipine 2. Start HCTZ 25mg  daily 2. Encourage improved lifestyle - low sodium diet, regular exercise 3. Continue to monitor BP outside office, bring readings to next visit, if persistently >140/90 or new symptoms notify office sooner      Relevant Medications   hydrochlorothiazide (HYDRODIURIL) 25 MG tablet   Other Relevant Orders   COMPLETE METABOLIC PANEL WITH GFR   Lipid panel   CBC with Differential/Platelet   TSH   Other Visit Diagnoses     Annual physical exam    -  Primary   Relevant Orders   COMPLETE METABOLIC PANEL WITH GFR   Lipid panel   CBC with Differential/Platelet   Hemoglobin A1c   TSH   Need for diphtheria-tetanus-pertussis (Tdap) vaccine       Relevant Orders   Tdap vaccine greater than or equal to 7yo IM (Completed)   Need for hepatitis C screening test       Relevant Orders   Hepatitis C antibody   Screening for diabetes mellitus (DM)        Relevant Orders   Hemoglobin A1c   Screening cholesterol level       Relevant Orders   Lipid panel   Lower extremity edema           Updated Health Maintenance information Reviewed recent lab results with patient Encouraged improvement to lifestyle with diet and exercise Goal of weight loss  Additionally for cough May be secondary to silent reflux LPR - trial on OTC PPI or can try Flonase for sinus drainage/allergy  Meds ordered this encounter  Medications   hydrochlorothiazide (HYDRODIURIL) 25 MG tablet    Sig: Take 1 tablet (25 mg total) by mouth daily.    Dispense:  90 tablet    Refill:  3      Follow up plan: Return in about 4 months (around 09/04/2021) for 4 month follow-up HTN, Weight, chronic cough?[name], DO [facility] 05/07/2021, 9:04 AM

## 2021-05-07 NOTE — Assessment & Plan Note (Addendum)
Abnormal weight gain over past few years, may be some inc fluid retention edema weight Encourage keep improving lifestyle diet exercise Future consider interventions with medication vs referral if indicated

## 2021-05-07 NOTE — Patient Instructions (Addendum)
Thank you for coming to the office today.  Stop AMlodipine, sine causing swelling  Start HCTZ Hydrochlorothiazide 25mg  daily in AM, it is a fluid pill can lose extra fluid. Stay well hydrated plenty of water.  Keep up with lifestyle diet exercise plan  We will check a full panel including other add on tests.  For cough  Can try sinus treatment Start nasal steroid Flonase 2 sprays in each nostril daily for 4-6 weeks, may repeat course seasonally or as needed  AND OR  Your symptoms sound most consistent with Silent Reflux or (Laryngopharyngeal Reflux), this is similar to Acid Reflux (or GERD) but usually involves the Throat and has a variety of symptoms including a "globus sensation", or air bubble or pressure in throat. Commonly occurs in patients who have had some symptoms of traditional heartburn before, but this can occur even when not eating spicy foods. - Start the PPI medicine over the counter either Omeprazole 20mg  daily or Nexium 20mg  daily - Take one capsule 30 min before first meal of day, same time every day for at least 2 weeks, max treatment up to 4 weeks then stop. If it resolves but then comes back again, you can repeat the 2-4 week course again as needed. - Avoid spicy, greasy, fried foods, also things like caffeine, dark chocolate, peppermint can worsen - Avoid large meals and late night snacks, also do not go more than 4-5 hours without a snack or meal (not eating will worsen reflux symptoms due to stomach acid)  If the problem improves but keeps coming back, we can discuss higher dose or longer course at next visit.  If symptoms are worsening, persistent symptoms despite treatment or develop esophageal or abdominal pain, unable to swallow solids or liquids, nausea, vomiting, fever/chills, or unintentional weight loss / no appetite, please follow-up sooner or seek more immediate medical attention.   Please schedule a Follow-up Appointment to: Return in about 4 months  (around 09/04/2021) for 4 month follow-up HTN, Weight, chronic cough?.  If you have any other questions or concerns, please feel free to call the office or send a message through MyChart. You may also schedule an earlier appointment if necessary.  Additionally, you may be receiving a survey about your experience at our office within a few days to 1 week by e-mail or mail. We value your feedback.  , DO [facility], 09/06/2021

## 2021-05-07 NOTE — Assessment & Plan Note (Signed)
Elevated BP Side effect on Amlodipine swelling Also secondary component w/ OSA - Home BP readings controlled    Plan:  1. DC Amlodipine 2. Start HCTZ 25mg  daily 2. Encourage improved lifestyle - low sodium diet, regular exercise 3. Continue to monitor BP outside office, bring readings to next visit, if persistently >140/90 or new symptoms notify office sooner

## 2021-05-07 NOTE — Assessment & Plan Note (Signed)
Well controlled, chronic OSA on CPAP - Good adherence to CPAP nightly - Continue current CPAP therapy, patient seems to be benefiting from therapy  

## 2021-05-08 LAB — COMPLETE METABOLIC PANEL WITH GFR
AG Ratio: 1.6 (calc) (ref 1.0–2.5)
ALT: 30 U/L (ref 9–46)
AST: 15 U/L (ref 10–40)
Albumin: 4.2 g/dL (ref 3.6–5.1)
Alkaline phosphatase (APISO): 60 U/L (ref 36–130)
BUN: 17 mg/dL (ref 7–25)
CO2: 30 mmol/L (ref 20–32)
Calcium: 8.9 mg/dL (ref 8.6–10.3)
Chloride: 107 mmol/L (ref 98–110)
Creat: 0.79 mg/dL (ref 0.60–1.26)
Globulin: 2.7 g/dL (calc) (ref 1.9–3.7)
Glucose, Bld: 95 mg/dL (ref 65–99)
Potassium: 4.3 mmol/L (ref 3.5–5.3)
Sodium: 141 mmol/L (ref 135–146)
Total Bilirubin: 0.4 mg/dL (ref 0.2–1.2)
Total Protein: 6.9 g/dL (ref 6.1–8.1)
eGFR: 116 mL/min/{1.73_m2} (ref >=60)

## 2021-05-08 LAB — HEMOGLOBIN A1C
Hgb A1c MFr Bld: 5.6 % of total Hgb (ref <5.7)
Mean Plasma Glucose: 114 mg/dL
eAG (mmol/L): 6.3 mmol/L

## 2021-05-08 LAB — CBC WITH DIFFERENTIAL/PLATELET
Absolute Monocytes: 589 cells/uL (ref 200–950)
Basophils Absolute: 57 cells/uL (ref 0–200)
Basophils Relative: 0.8 %
Eosinophils Absolute: 206 cells/uL (ref 15–500)
Eosinophils Relative: 2.9 %
HCT: 43.7 % (ref 38.5–50.0)
Hemoglobin: 14.2 g/dL (ref 13.2–17.1)
Lymphs Abs: 1427 cells/uL (ref 850–3900)
MCH: 27 pg (ref 27.0–33.0)
MCHC: 32.5 g/dL (ref 32.0–36.0)
MCV: 83.1 fL (ref 80.0–100.0)
MPV: 10.3 fL (ref 7.5–12.5)
Monocytes Relative: 8.3 %
Neutro Abs: 4821 cells/uL (ref 1500–7800)
Neutrophils Relative %: 67.9 %
Platelets: 266 10*3/uL (ref 140–400)
RBC: 5.26 10*6/uL (ref 4.20–5.80)
RDW: 13.5 % (ref 11.0–15.0)
Total Lymphocyte: 20.1 %
WBC: 7.1 10*3/uL (ref 3.8–10.8)

## 2021-05-08 LAB — LIPID PANEL
Cholesterol: 147 mg/dL (ref <200)
HDL: 42 mg/dL (ref >=40)
LDL Cholesterol (Calc): 89 mg/dL (calc)
Non-HDL Cholesterol (Calc): 105 mg/dL (calc) (ref <130)
Total CHOL/HDL Ratio: 3.5 (calc) (ref <5.0)
Triglycerides: 75 mg/dL (ref <150)

## 2021-05-08 LAB — HEPATITIS C ANTIBODY
Hepatitis C Ab: NONREACTIVE
SIGNAL TO CUT-OFF: 0.08 (ref <1.00)

## 2021-05-08 LAB — TESTOSTERONE: Testosterone: 58 ng/dL — ABNORMAL LOW (ref 250–827)

## 2021-05-08 LAB — TSH: TSH: 1.53 mIU/L (ref 0.40–4.50)

## 2021-05-08 LAB — VITAMIN D 25 HYDROXY (VIT D DEFICIENCY, FRACTURES): Vit D, 25-Hydroxy: 34 ng/mL (ref 30–100)

## 2021-05-15 ENCOUNTER — Encounter: Payer: Self-pay | Admitting: Family Medicine

## 2021-05-15 DIAGNOSIS — E291 Testicular hypofunction: Secondary | ICD-10-CM

## 2021-05-29 NOTE — Progress Notes (Addendum)
05/30/21 12:27 PM   Alan Duncan Jan 15, 1981 [id number]  Referring provider:  Olin Hauser, DO [address] [place],  [place] [address] Chief Complaint  Patient presents with   New Patient (Initial Visit)    hypogonadism     HPI: Alan Duncan is a 41 y.o.male who presents today for further evaluation of hypogonadism in male.   During annual physical exam with PCP, [name], DO,he had his Testerone levels checked. His testosterone on 04/27/2021 was 58 he was further referred to urology.   He reports that he ask his primary care to check this because he was having decreased libido, increasing difficulties maintaining his erection and overall decrease in energy over the last 8 or so months.  Please see [name] and Shim below.  Medical comorbidities include morbid obesity, BMI 52.  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  He is more sedentary job now and is gained close to 100 pounds as a result.  Since January, he is engaged in a more active lifestyle as well as watching what he eats and has been able to lose about 15 pounds.  He does not have any biological children but does not desire any.  His wife is 45 years older and they both have decided not to have kids.   Androgen Deficiency in the Aging Male     [place] Name 05/30/21 0800         Androgen Deficiency in the Aging Male   Do you have a decrease in libido (sex drive) Yes     Do you have lack of energy Yes     Do you have a decrease in strength and/or endurance Yes     Have you lost height No     Have you noticed a decreased enjoyment of life No     Are you sad and/or grumpy No     Are your erections less strong Yes     Have you noticed a recent deterioration in your ability to play sports No     Are you falling asleep after dinner No     Has there been a recent deterioration in your work performance No               SHIM     Row Name 05/30/21 0830         SHIM: Over the last 6 months:   How do you rate your confidence that you could get and keep an erection? Moderate     When you had erections with sexual stimulation, how often were your erections hard enough for penetration (entering your partner)? Most Times (much more than half the time)     During sexual intercourse, how often were you able to maintain your erection after you had penetrated (entered) your partner? Most Times (much more than half the time)     During sexual intercourse, how difficult was it to maintain your erection to completion of intercourse? Slightly Difficult     When you attempted sexual intercourse, how often was it satisfactory for you? Most Times (much more than half the time)       SHIM Total Score   SHIM 19                PMH: Past Medical History:  Diagnosis Date   Sleep apnea     Surgical  History: No past surgical history on file.  Home Medications:  Allergies as of 05/30/2021   No Known Allergies      Medication List        Accurate as of May 30, 2021 12:27 PM. If you have any questions, ask your nurse or doctor.          clomiPHENE 50 MG tablet Commonly known as: CLOMID Take 0.5 tablets (25 mg total) by mouth daily. Started by: Hollice Espy, MD   hydrochlorothiazide 25 MG tablet Commonly known as: HYDRODIURIL Take 1 tablet (25 mg total) by mouth daily.   sildenafil 20 MG tablet Commonly known as: REVATIO Take 1-5 tablets by mouth as needed prior to intercourse Started by: Hollice Espy, MD        Allergies: No Known Allergies  Family History: Family History  Problem Relation Age of Onset   Hypertension Father    Heart disease Maternal Grandmother    Heart disease Maternal Grandfather    Kidney failure Maternal Grandfather    Cancer Maternal Grandfather    Hypertension Paternal Grandmother    Prostate cancer Neg Hx    Colon cancer Neg Hx     Breast cancer Neg Hx     Social History:  reports that he has never smoked. He has never been exposed to tobacco smoke. He has never used smokeless tobacco. He reports current alcohol use. He reports that he does not use drugs.   Physical Exam: BP (!) 153/90    Pulse 82    Ht 5\' 9"  (1.753 m)    Wt (!) 353 lb (160.1 kg)    BMI 52.13 kg/m   Constitutional:  Alert and oriented, No acute distress. HEENT: [place] AT, moist mucus membranes.  Trachea midline, no masses. Cardiovascular: No clubbing, cyanosis, or edema. Respiratory: Normal respiratory effort, no increased work of breathing. GU: Bilateral distended testicles, normal size without masses Endocrine: Normal male distribution hair pattern Skin: No rashes, bruises or suspicious lesions. Neurologic: Grossly intact, no focal deficits, moving all 4 extremities. Psychiatric: Normal mood and affect.  Laboratory Data: Lab Results  Component Value Date   CREATININE 0.79 05/07/2021   Lab Results  Component Value Date   TESTOSTERONE 58 (L) 05/07/2021   Lab Results  Component Value Date   HGBA1C 5.6 05/07/2021    Assessment & Plan:    1. 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  He is most interested in Clomid therapy at this time along with treatment of his erectile dysfunction and weight loss management.  He hopes in combination, these 3 medications will treat his underlying symptoms as well as improve his overall health.  If this fails, he may consider exogenous estrogen in the form of either gels or injectables.  We will plan to have him return in 3 months with LFTs/testosterone - FSH/LH; Future - Estradiol; Future - Testosterone; Future - PSA; Future - Prolactin; Future -  sildenafil (REVATIO) 20 MG tablet; Take 1-5 tablets by mouth as needed prior to intercourse  Dispense: 30 tablet; Refill: 11 - clomiPHENE (CLOMID) 50 MG tablet; Take 0.5 tablets (25 mg total) by mouth daily.  Dispense: 15 tablet; Refill: 11  2. 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 Discussed possible treatment options including PDE 5 inhibitors.  In terms of PDE 5 inhibitors, we discussed contraindications for this medication as well as common side effects. Patient was counseled on optimal use. All of his questions were answered in detail.  - sildenafil (REVATIO) 20 MG tablet; Take 1-5 tablets by mouth as needed prior to intercourse  Dispense: 30 tablet; Refill: 11  3. Morbid obesity with BMI of 50.0-59.9, adult ([place]) We discussed the pathophysiology of metabolic disease and how this can affect testosterone especially with the aromatase conversion of testosterone to estrogen or estradiol.  Discussed the overall importance of weight loss in relation to his hypogonadism as well as other current and potential future health issues.  He does understand that this is a priority.  He is interested in referral to medical weight loss management clinic to help in this effort.  He is not interested in bariatric surgery. - Amb Ref to Medical Weight Management  F/u 3 month with lab including testosterone/LFT  Conley Rolls as a scribe for Hollice Espy, MD.,have documented all relevant documentation on the behalf of Hollice Espy, MD,as directed by  Hollice Espy, MD while in the presence of Hollice Espy, [place] [address], [place], [place] [address] [phone]

## 2021-05-30 ENCOUNTER — Ambulatory Visit (INDEPENDENT_AMBULATORY_CARE_PROVIDER_SITE_OTHER): Payer: BC Managed Care – PPO | Admitting: Urology

## 2021-05-30 ENCOUNTER — Encounter: Payer: Self-pay | Admitting: Urology

## 2021-05-30 ENCOUNTER — Other Ambulatory Visit
Admission: RE | Admit: 2021-05-30 | Discharge: 2021-05-30 | Disposition: A | Discharge status: Home / Self Care | Payer: BC Managed Care – PPO | Attending: Urology | Admitting: Urology

## 2021-05-30 ENCOUNTER — Other Ambulatory Visit: Payer: Self-pay

## 2021-05-30 VITALS — BP 153/90 | HR 82 | Ht 69.0 in | Wt 353.0 lb

## 2021-05-30 DIAGNOSIS — Z6841 Body Mass Index (BMI) 40.0 and over, adult: Secondary | ICD-10-CM | POA: Diagnosis not present

## 2021-05-30 DIAGNOSIS — E291 Testicular hypofunction: Secondary | ICD-10-CM | POA: Insufficient documentation

## 2021-05-30 DIAGNOSIS — N521 Erectile dysfunction due to diseases classified elsewhere: Secondary | ICD-10-CM

## 2021-05-30 LAB — PSA: Prostatic Specific Antigen: 0.48 ng/mL (ref 0.00–4.00)

## 2021-05-30 MED ORDER — SILDENAFIL CITRATE 20 MG PO TABS: ORAL_TABLET | ORAL | 11 refills | Status: DC

## 2021-05-30 MED ORDER — CLOMIPHENE CITRATE 50 MG PO TABS: 25.0000 mg | ORAL_TABLET | Freq: Every day | ORAL | 11 refills | Status: DC

## 2021-05-31 LAB — ESTRADIOL: Estradiol: 30.1 pg/mL (ref 7.6–42.6)

## 2021-05-31 LAB — FSH/LH
FSH: 1.1 m[IU]/mL — ABNORMAL LOW (ref 1.5–12.4)
LH: 2.5 m[IU]/mL (ref 1.7–8.6)

## 2021-05-31 LAB — TESTOSTERONE: Testosterone: 61 ng/dL — ABNORMAL LOW (ref 264–916)

## 2021-05-31 LAB — PROLACTIN: Prolactin: 46.2 ng/mL — ABNORMAL HIGH (ref 4.0–15.2)

## 2021-06-02 ENCOUNTER — Telehealth: Payer: Self-pay | Admitting: *Deleted

## 2021-06-02 DIAGNOSIS — R7989 Other specified abnormal findings of blood chemistry: Secondary | ICD-10-CM

## 2021-06-02 NOTE — Telephone Encounter (Addendum)
Patient informed, voiced understanding.  Brain MRI ordered   ----- Message from Hollice Espy, MD sent at 06/01/2021  4:33 PM EST ----- Your prolactin level is actually elevated which was a little bit of a surprise.  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  Generally, when one of these is present, the prolactin is much higher than your level thus my suspicion is fairly low but want to be sure.  I do think it is reasonable however to go ahead and check a brain MRI if you are willing to rule this out.  I still think we can proceed with our original plan but would like to get the study as a precaution.  Becca, please place order for MRI of the brain with contrast, we can call with the results.  Hollice Espy, MD

## 2021-06-12 ENCOUNTER — Other Ambulatory Visit: Payer: Self-pay | Admitting: *Deleted

## 2021-06-12 DIAGNOSIS — R7989 Other specified abnormal findings of blood chemistry: Secondary | ICD-10-CM

## 2021-06-14 ENCOUNTER — Ambulatory Visit
Admission: RE | Admit: 2021-06-14 | Discharge: 2021-06-14 | Disposition: A | Discharge status: Home / Self Care | Payer: BC Managed Care – PPO | Source: Ambulatory Visit | Attending: Urology | Admitting: Urology

## 2021-06-14 ENCOUNTER — Other Ambulatory Visit: Payer: Self-pay

## 2021-06-14 DIAGNOSIS — E221 Hyperprolactinemia: Secondary | ICD-10-CM | POA: Diagnosis not present

## 2021-06-14 DIAGNOSIS — R7989 Other specified abnormal findings of blood chemistry: Secondary | ICD-10-CM | POA: Diagnosis not present

## 2021-06-14 MED ORDER — GADOBUTROL 1 MMOL/ML IV SOLN
10.0000 mL | Freq: Once | INTRAVENOUS | Status: AC | PRN
  Administered 2021-06-14: 10 mL via INTRAVENOUS

## 2021-06-16 ENCOUNTER — Telehealth: Payer: Self-pay | Admitting: *Deleted

## 2021-06-16 DIAGNOSIS — R7989 Other specified abnormal findings of blood chemistry: Secondary | ICD-10-CM

## 2021-06-16 DIAGNOSIS — E291 Testicular hypofunction: Secondary | ICD-10-CM

## 2021-06-16 NOTE — Telephone Encounter (Addendum)
Patient informed, scheduled 3 month follow up   ----- Message from [name], MD sent at 06/16/2021 12:31 PM EST ----- MRI is negative.  That is great news.   [name], MD

## 2021-09-19 ENCOUNTER — Other Ambulatory Visit: Payer: BC Managed Care – PPO

## 2021-09-23 ENCOUNTER — Ambulatory Visit: Payer: BC Managed Care – PPO | Admitting: Urology

## 2021-10-20 ENCOUNTER — Other Ambulatory Visit: Payer: BC Managed Care – PPO

## 2021-10-20 DIAGNOSIS — E291 Testicular hypofunction: Secondary | ICD-10-CM | POA: Diagnosis not present

## 2021-10-20 DIAGNOSIS — R7989 Other specified abnormal findings of blood chemistry: Secondary | ICD-10-CM | POA: Diagnosis not present

## 2021-10-21 LAB — HEPATIC FUNCTION PANEL
ALT: 20 IU/L (ref 0–44)
AST: 13 IU/L (ref 0–40)
Albumin: 4.3 g/dL (ref 4.0–5.0)
Alkaline Phosphatase: 50 IU/L (ref 44–121)
Bilirubin Total: <0.2 mg/dL (ref 0.0–1.2)
Bilirubin, Direct: <0.1 mg/dL (ref 0.00–0.40)
Total Protein: 6.8 g/dL (ref 6.0–8.5)

## 2021-10-21 LAB — TESTOSTERONE: Testosterone: 181 ng/dL — ABNORMAL LOW (ref 264–916)

## 2021-10-21 NOTE — Progress Notes (Signed)
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   During annual physical exam with PCP, Alan Pilar, DO,he had his Testerone levels checked. His testosterone on 04/27/2021 was 58.   He has a PSA of 0.4 on 05/30/2021.   He elected to start Clomid which has been taken for the past 3 months.  His most recent testosterone was 181 on 10/20/2021. LFTs were WNL.   He does report that his overall energy level is improved significantly.  Has been going to the gym about 4 times a week.  He is lost some weight.  Overall feeling well.  He does think that the Clomid has made a difference.   PMH: Past Medical History:  Diagnosis Date   Sleep apnea     Surgical History: No past surgical history on file.  Home Medications:  Allergies as of 10/22/2021   No Known Allergies      Medication List        Accurate as of October 22, 2021  9:36 AM. If you have any questions, ask your nurse or doctor.          clomiPHENE 50 MG tablet Commonly known as: CLOMID Take 0.5 tablets (25 mg total) by mouth daily.   hydrochlorothiazide 25 MG tablet Commonly known as: HYDRODIURIL Take 1 tablet (25 mg total) by mouth daily.   sildenafil 20 MG tablet Commonly known as: REVATIO Take 1-5 tablets by mouth as needed prior to intercourse        Allergies:  No Known Allergies  Family History: Family History  Problem Relation Age of Onset   Hypertension Father    Heart disease Maternal Grandmother    Heart disease Maternal Grandfather    Kidney failure Maternal Grandfather    Cancer Maternal  Grandfather    Hypertension Paternal Grandmother    Prostate cancer Neg Hx    Colon cancer Neg Hx    Breast cancer Neg Hx     Social History:  reports that he has never smoked. He has never been exposed to tobacco smoke. He has never used smokeless tobacco. He reports current alcohol use. He reports that he does not use drugs.   Physical Exam: BP (!) 150/92   Pulse 89   Ht 5\' 9"  (1.753 m)   Wt (!) 353 lb (160.1 kg)   BMI 52.13 kg/m   Constitutional:  Alert and oriented, No acute distress. HEENT: [place] AT, moist mucus membranes.  Trachea midline, no masses. Cardiovascular: No clubbing, cyanosis, or edema. Respiratory: Normal respiratory effort, no increased work of breathing. Skin: No rashes, bruises or suspicious lesions. Neurologic: Grossly intact, no focal deficits, moving all 4 extremities. Psychiatric: Normal mood and affect.  Laboratory Data:  Lab Results  Component Value Date   CREATININE 0.79 05/07/2021   Lab Results  Component Value Date   HGBA1C 5.6 05/07/2021    Assessment & Plan:    1. Hypogonadism in male Although testosterone remains low, he had a 3 fold increase in the value and has had clinical improvement  We discussed alternative therapies any like to continue Clomid 25  mg daily- renewed for 1 year  Plan for follow-up in any year with labs  - CBC with Differential/Platelet; Future - Testosterone; Future - PSA; Future - Hepatic function panel; Future   [facility] [address] [place], [place] [address] [phone]

## 2021-10-22 ENCOUNTER — Encounter: Payer: Self-pay | Admitting: Urology

## 2021-10-22 ENCOUNTER — Ambulatory Visit (INDEPENDENT_AMBULATORY_CARE_PROVIDER_SITE_OTHER): Payer: BC Managed Care – PPO | Admitting: Urology

## 2021-10-22 VITALS — BP 150/92 | HR 89 | Ht 69.0 in | Wt 353.0 lb

## 2021-10-22 DIAGNOSIS — E291 Testicular hypofunction: Secondary | ICD-10-CM

## 2021-10-22 MED ORDER — CLOMIPHENE CITRATE 50 MG PO TABS: 25.0000 mg | ORAL_TABLET | Freq: Every day | ORAL | 11 refills | Status: DC

## 2021-11-07 ENCOUNTER — Ambulatory Visit: Payer: BC Managed Care – PPO | Admitting: Family Medicine

## 2022-01-26 DIAGNOSIS — L03032 Cellulitis of left toe: Secondary | ICD-10-CM | POA: Diagnosis not present

## 2022-01-26 DIAGNOSIS — L03031 Cellulitis of right toe: Secondary | ICD-10-CM | POA: Diagnosis not present

## 2022-02-09 DIAGNOSIS — L03032 Cellulitis of left toe: Secondary | ICD-10-CM | POA: Diagnosis not present

## 2022-02-09 DIAGNOSIS — L03031 Cellulitis of right toe: Secondary | ICD-10-CM | POA: Diagnosis not present

## 2022-04-17 ENCOUNTER — Other Ambulatory Visit: Payer: Self-pay | Admitting: Family Medicine

## 2022-04-17 DIAGNOSIS — I10 Essential (primary) hypertension: Secondary | ICD-10-CM

## 2022-04-18 NOTE — Telephone Encounter (Signed)
Requested medication (s) are due for refill today: yes  Requested medication (s) are on the active medication list: yes  Last refill:  05/07/21 #90 3 RF  Future visit scheduled: no  Notes to clinic:  needs lab work   Requested Prescriptions  Pending Prescriptions Disp Refills   hydrochlorothiazide (HYDRODIURIL) 25 MG tablet [Pharmacy Med Name: HYDROCHLOROTHIAZIDE 25MG  TABLETS] 90 tablet 3    Sig: TAKE 1 TABLET(25 MG) BY MOUTH DAILY     Cardiovascular: Diuretics - Thiazide Failed - 04/17/2022  8:00 AM      Failed - Cr in normal range and within 180 days    Creat  Date Value Ref Range Status  05/07/2021 0.79 0.60 - 1.26 mg/dL Final         Failed - K in normal range and within 180 days    Potassium  Date Value Ref Range Status  05/07/2021 4.3 3.5 - 5.3 mmol/L Final         Failed - Na in normal range and within 180 days    Sodium  Date Value Ref Range Status  05/07/2021 141 135 - 146 mmol/L Final  01/11/2020 136 (A) 137 - 147 Final         Failed - Last BP in normal range    BP Readings from Last 1 Encounters:  10/22/21 (!) 150/92         Failed - Valid encounter within last 6 months    Recent Outpatient Visits           11 months ago Annual physical exam   [facility], DO   1 year ago Breast pain, left   [facility], DO   2 years ago Essential hypertension   [facility] [place], [place], [place]   2 years ago Essential hypertension   [facility] [place], [place], [place]   3 years ago Essential hypertension   [facility] [place], [place], [place]

## 2022-05-19 ENCOUNTER — Other Ambulatory Visit: Payer: Self-pay

## 2022-05-19 DIAGNOSIS — E291 Testicular hypofunction: Secondary | ICD-10-CM

## 2022-05-19 MED ORDER — CLOMIPHENE CITRATE 50 MG PO TABS: 25.0000 mg | ORAL_TABLET | Freq: Every day | ORAL | 6 refills | Status: DC

## 2022-06-20 ENCOUNTER — Encounter: Payer: Self-pay | Admitting: Family Medicine

## 2022-07-14 IMAGING — MR MR HEAD WO/W CM
13 of 19 series · 24 of 48 positions shown · IV contrast (10ml Gadavist)
Comparison: None.

CLINICAL DATA: Abnormal prolactin

EXAM:
MRI HEAD WITHOUT AND WITH CONTRAST
TECHNIQUE: Multiplanar, multiecho pulse sequences of the brain and surrounding
structures were obtained without and with intravenous contrast.
CONTRAST:  10mL GADAVIST GADOBUTROL 1 MMOL/ML IV SOLN

[Series 5: T1 · sagittal · 5.0mm · 0.62mm/px · 1 of 25 slices shown]
[im 1/25]
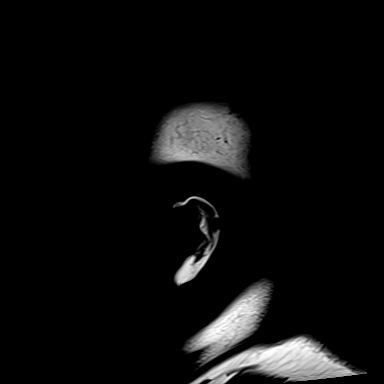

[Series 8: T2 · axial · 5.0mm · 0.53mm/px · z∈[-33,+104]mm · 2 of 25 slices shown (1 of 2)]
[im 1/25]
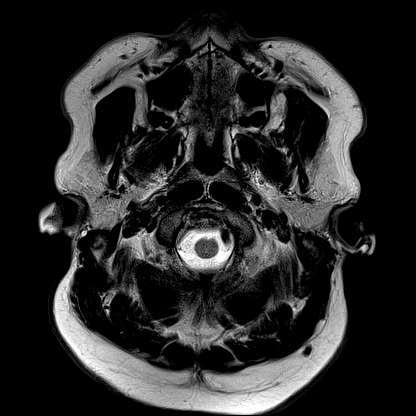
[im 25/25]
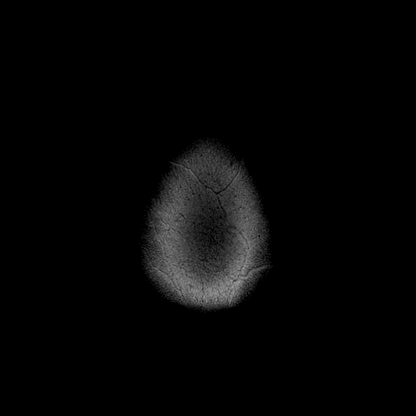

[Series 10: FLAIR · axial · 3.0mm · 0.53mm/px · z∈[-42,+113]mm · 4 of 55 slices shown]
[im 1/55]
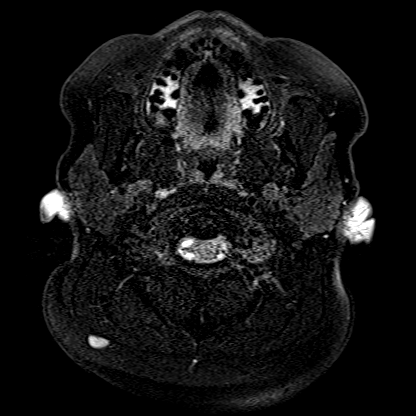
[im 19/55]
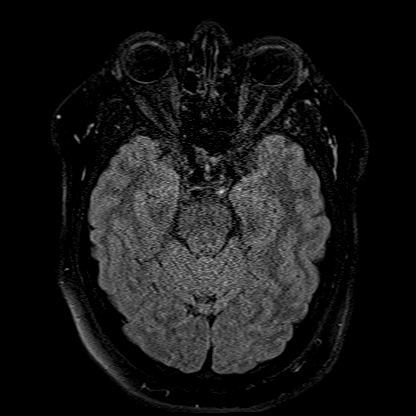
[im 37/55]
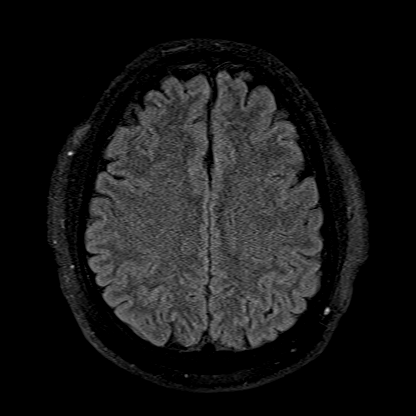
[im 55/55]
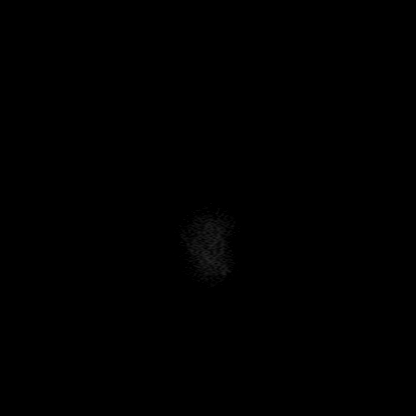

[Series 13: T2 · coronal · 3.0mm · 0.21mm/px · 1 of 13 slices shown (2 of 2)]
[im 1/13]
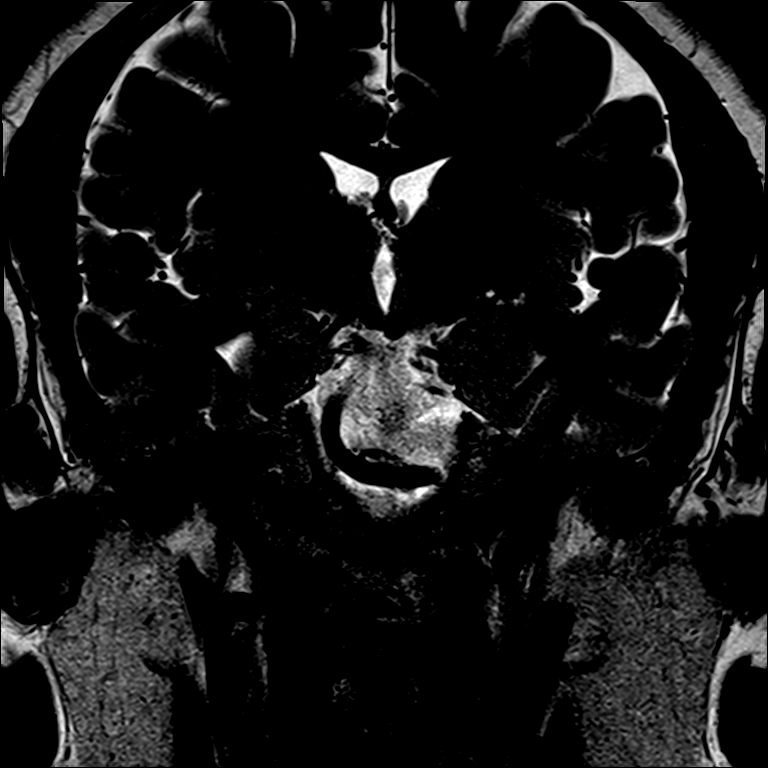

[Series 15: T1 post-contrast · coronal · 3.0mm · 0.28mm/px · 1 of 11 slices shown (1 of 9)]
[im 1/11]
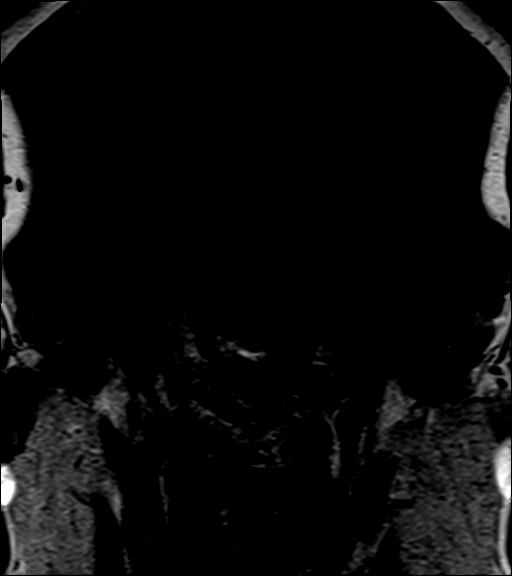

[Series 16: T1 post-contrast · coronal · 3.0mm · 0.28mm/px · 1 of 11 slices shown (2 of 9)]
[im 1/11]
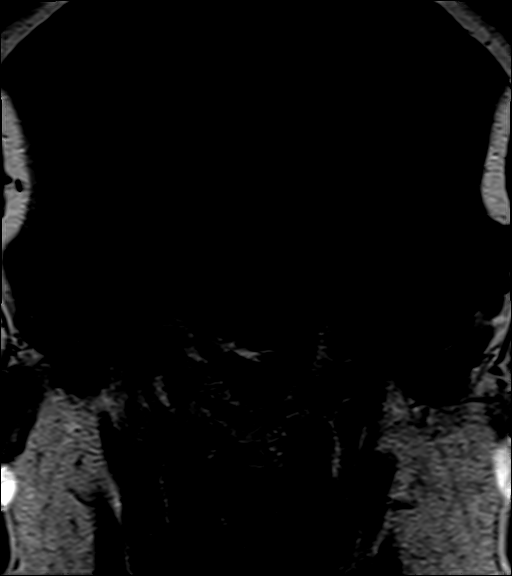

[Series 17: T1 post-contrast · coronal · 3.0mm · 0.28mm/px · 1 of 11 slices shown (3 of 9)]
[im 1/11]
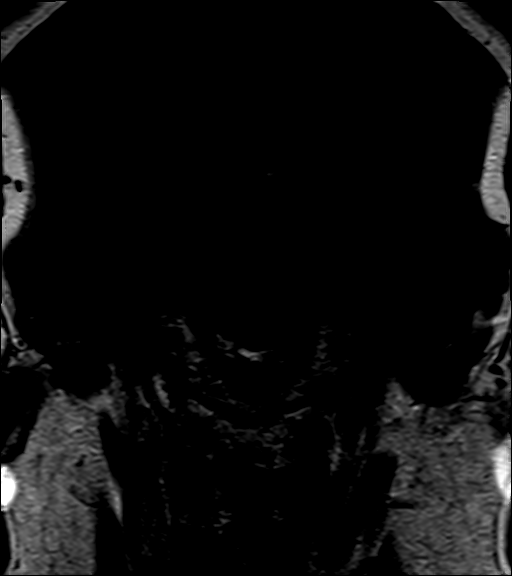

[Series 18: T1 post-contrast · coronal · 3.0mm · 0.28mm/px · 1 of 11 slices shown (4 of 9)]
[im 1/11]
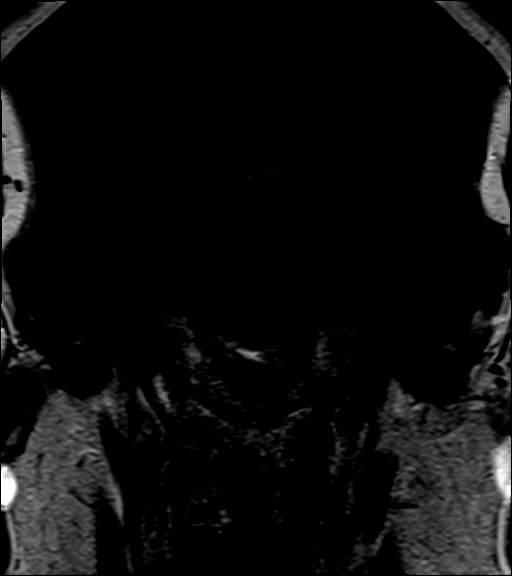

[Series 19: T1 post-contrast · coronal · 3.0mm · 0.28mm/px · 1 of 11 slices shown (5 of 9)]
[im 1/11]
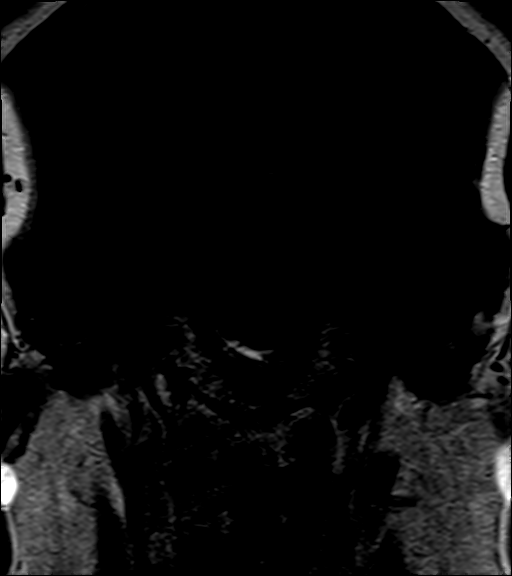

[Series 20: T1 post-contrast · coronal · 3.0mm · 0.28mm/px · 1 of 11 slices shown (6 of 9)]
[im 1/11]
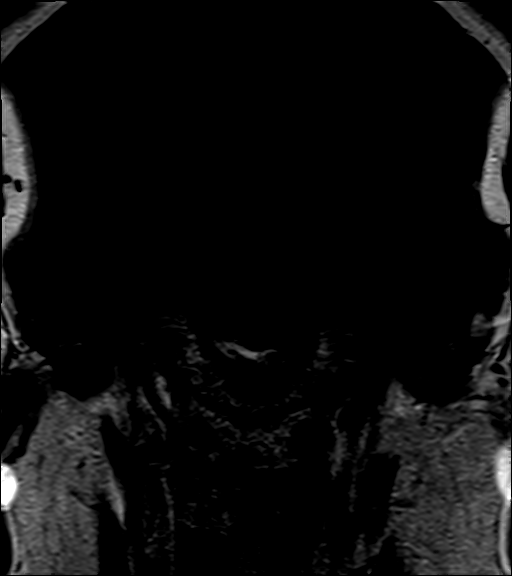

[Series 21: T1 post-contrast · coronal · 3.0mm · 0.21mm/px · 1 of 13 slices shown (7 of 9)]
[im 1/13]
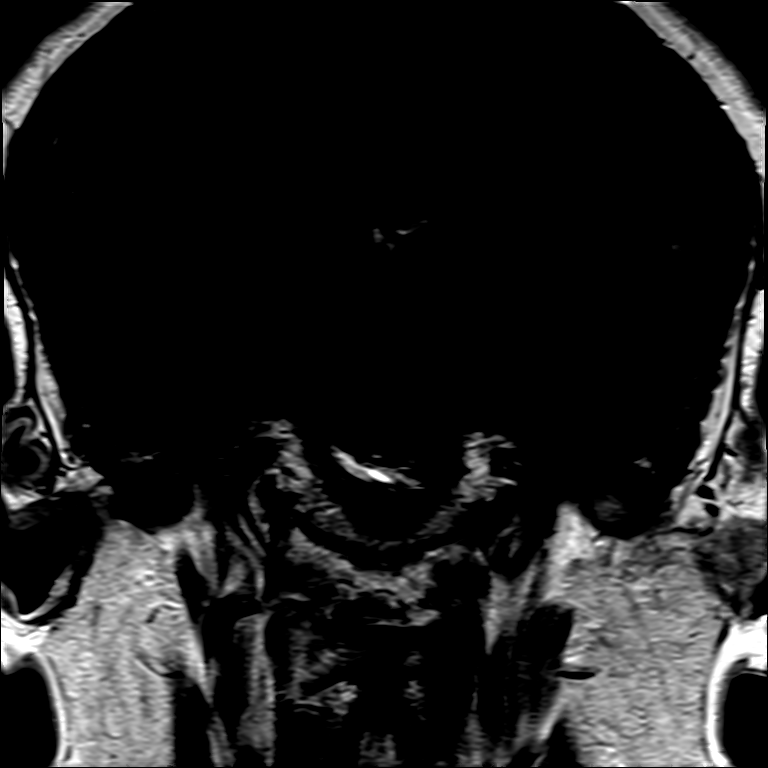

[Series 22: T1 post-contrast · sagittal · 3.0mm · 0.21mm/px · 1 of 13 slices shown (8 of 9)]
[im 1/13]
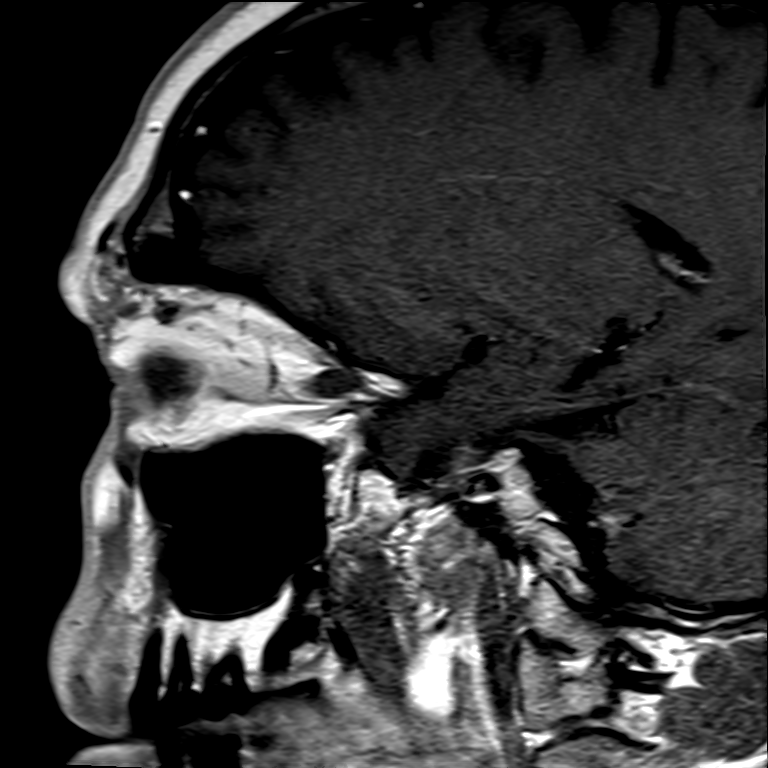

[Series 23: T1 post-contrast · axial · 1.0mm · 0.98mm/px · z∈[-28,+134]mm · 8 of 176 slices shown (9 of 9)]
[im 1/176]
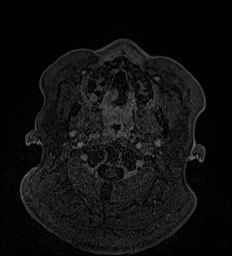
[im 27/176]
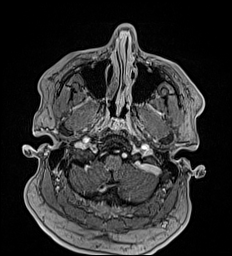
[im 54/176]
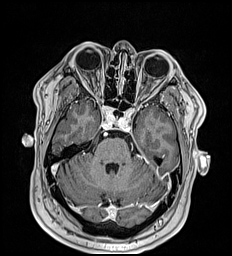
[im 81/176]
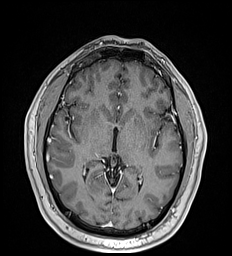
[im 95/176]
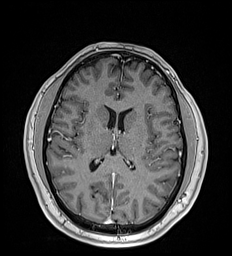
[im 122/176]
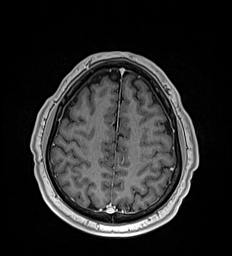
[im 149/176]
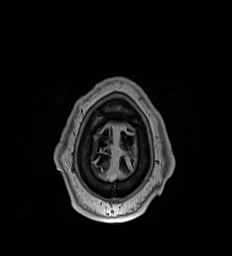
[im 176/176]
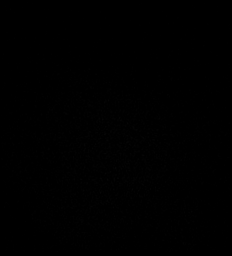

[24 of 48 positions shown; findings below may reference images not displayed]

FINDINGS: Sella: There is no sellar or suprasellar mass. Pituitary
infundibulum is midline.

Brain: There is no acute infarction or intracranial hemorrhage.
There is no intracranial mass, mass effect, or edema. There is no
hydrocephalus or extra-axial fluid collection. Ventricles and sulci
are normal in size and configuration. No abnormal enhancement.

Vascular: Major vessel flow voids at the skull base are preserved.

Skull and upper cervical spine: Normal marrow signal is preserved.

Sinuses/Orbits: Paranasal sinuses are aerated. Orbits are
unremarkable.

Other: Sella is unremarkable.  Mastoid air cells are clear.
IMPRESSION: No sellar or suprasellar mass.

## 2022-07-17 ENCOUNTER — Encounter: Payer: Self-pay | Admitting: Family Medicine

## 2022-07-17 ENCOUNTER — Ambulatory Visit: Payer: BC Managed Care – PPO | Admitting: Family Medicine

## 2022-07-17 VITALS — BP 134/88 | HR 95 | Ht 69.0 in | Wt 368.0 lb

## 2022-07-17 DIAGNOSIS — G4733 Obstructive sleep apnea (adult) (pediatric): Secondary | ICD-10-CM | POA: Diagnosis not present

## 2022-07-17 DIAGNOSIS — I10 Essential (primary) hypertension: Secondary | ICD-10-CM

## 2022-07-17 DIAGNOSIS — Z6841 Body Mass Index (BMI) 40.0 and over, adult: Secondary | ICD-10-CM | POA: Diagnosis not present

## 2022-07-17 MED ORDER — HYDROCHLOROTHIAZIDE 25 MG PO TABS: 25.0000 mg | ORAL_TABLET | Freq: Every day | ORAL | 3 refills | Status: DC

## 2022-07-17 MED ORDER — ZEPBOUND 2.5 MG/0.5ML ~~LOC~~ SOAJ: 2.5000 mg | SUBCUTANEOUS | 0 refills | Status: DC

## 2022-07-17 NOTE — Progress Notes (Unsigned)
Subjective:    Patient ID: Alan Duncan, male    DOB: January 03, 1981, 42 y.o.   MRN: [id number]  Alan Duncan is a 42 y.o. male presenting on 07/17/2022 for Obesity   HPI  Morbid Obesity BMI >54 Current weight today 368 lbs Now working 3rd shift after Christmas, he had setback  Previously he was doing well with work out and lifestyle and lost 30 lbs and then lost the momentum after schedule change. Gained some weight back He has shifted back to days and doing well. Goal now to work on full meal plan with his wife, and she will start working on exercise plan  CHRONIC HTN Reports high BP for years - 5-10 years in past BP trend at home, Home readings 130/80, highest 144/84. He had labs done through work biometric physical, will send copy to [place] Current Meds - Amlodipine 5mg  daily He admits side effect swelling edema worse in evening in legs. Lifestyle: - Diet: improving diet again Denies CP, dyspnea, HA, dizziness / lightheadedness   OSA, on CPAP - Patient reports prior history of dx OSA and on CPAP for years, prior to treatment initial symptoms were snoring, daytime sleepiness and fatigue, has had several sleep studies in the past. - Today reports that sleep apnea is well controlled. He uses the CPAP machine every night. Tolerates the machine well, and thinks that sleeps better with it and feels good. No new concerns or symptoms.      07/18/2022    8:54 AM 05/07/2021    8:55 AM 11/03/2019    3:54 PM  Depression screen PHQ 2/9  Decreased Interest 0 0 0  Down, Depressed, Hopeless 0 0 0  PHQ - 2 Score 0 0 0  Altered sleeping  0   Tired, decreased energy  2   Change in appetite  1   Feeling bad or failure about yourself   0   Trouble concentrating  0   Moving slowly or fidgety/restless  0   Suicidal thoughts  0   PHQ-9 Score  3   Difficult doing work/chores  Not difficult at all     Social History   Tobacco Use   Smoking status: Never    Passive exposure: Never    Smokeless tobacco: Never  Vaping Use   Vaping Use: Never used  Substance Use Topics   Alcohol use: Yes    Comment: social   Drug use: No    Review of Systems Per HPI unless specifically indicated above     Objective:    BP 134/88 (BP Location: Left Arm, Cuff Size: Large)   Pulse 95   Ht 5\' 9"  (1.753 m)   Wt (!) 368 lb (166.9 kg)   SpO2 97%   BMI 54.34 kg/m   Wt Readings from Last 3 Encounters:  07/17/22 (!) 368 lb (166.9 kg)  10/22/21 (!) 353 lb (160.1 kg)  05/30/21 (!) 353 lb (160.1 kg)    Physical Exam Vitals and nursing note reviewed.  Constitutional:      General: He is not in acute distress.    Appearance: Normal appearance. He is well-developed. He is obese. He is not diaphoretic.     Comments: Well-appearing, comfortable, cooperative  HENT:     Head: Normocephalic and atraumatic.  Eyes:     General:        Right eye: No discharge.        Left eye: No discharge.     Conjunctiva/sclera: Conjunctivae normal.  Cardiovascular:     Rate and Rhythm: Normal rate.  Pulmonary:     Effort: Pulmonary effort is normal.  Musculoskeletal:     Right lower leg: No edema.     Left lower leg: No edema.  Skin:    General: Skin is warm and dry.     Findings: No erythema or rash.  Neurological:     Mental Status: He is alert and oriented to person, place, and time.  Psychiatric:        Mood and Affect: Mood normal.        Behavior: Behavior normal.        Thought Content: Thought content normal.     Comments: Well groomed, good eye contact, normal speech and thoughts    Results for orders placed or performed in visit on 10/20/21  Testosterone  Result Value Ref Range   Testosterone 181 (L) 264 - 916 ng/dL  Hepatic function panel  Result Value Ref Range   Total Protein 6.8 6.0 - 8.5 g/dL   Albumin 4.3 4.0 - 5.0 g/dL   Bilirubin Total <0.2 0.0 - 1.2 mg/dL   Bilirubin, Direct <0.10 0.00 - 0.40 mg/dL   Alkaline Phosphatase 50 44 - 121 IU/L   AST 13 0 - 40 IU/L   ALT  20 0 - 44 IU/L      Assessment & Plan:   Problem List Items Addressed This Visit     Essential hypertension    Mild elevated BP. Improve manual repeat Also secondary component w/ OSA - Home BP readings controlled Off Amlodipine (Swelling)   Plan:  Continue HCTZ 25mg  daily Encourage improved lifestyle - low sodium diet, regular exercise Continue to monitor BP outside office, bring readings to next visit, if persistently >140/90 or new symptoms notify office sooner  Emphasis on weight loss now w/ therapy Consider 2nd agent ARB if indicated      Relevant Medications   hydrochlorothiazide (HYDRODIURIL) 25 MG tablet   Morbid obesity with BMI of 50.0-59.9, adult ([place]) - Primary    Persistent abnormal weight gain, BMI 54 Unable to effectively lose weight with lifestyle Start Zepbound 2.5mg  weekly GLP/GIP injection for weight loss Encourage keep improving lifestyle diet exercise      Relevant Medications   ZEPBOUND 2.5 MG/0.5ML Pen   OSA on CPAP    Well controlled, chronic OSA on CPAP - Good adherence to CPAP nightly - Continue current CPAP therapy, patient seems to be benefiting from therapy        Meds ordered this encounter  Medications   ZEPBOUND 2.5 MG/0.5ML Pen    Sig: Inject 2.5 mg into the skin once a week.    Dispense:  2 mL    Refill:  0   hydrochlorothiazide (HYDRODIURIL) 25 MG tablet    Sig: Take 1 tablet (25 mg total) by mouth daily.    Dispense:  90 tablet    Refill:  3      Follow up plan: Return in about 3 months (around 10/17/2022) for 3 month fasting lab only then 1 week later Annual Physical.  Future labs 09/2022  [name], [facility] 07/17/2022, 8:11 AM

## 2022-07-17 NOTE — Patient Instructions (Addendum)
Thank you for coming to the office today.  Re order HCTZ 25mg  daily  ---------- Call insurance find cost and coverage of the following - check the following: - Drug Tier, Preferred List, On Formulary - All will require a "Prior Authorization" from [place] first, before you can find out the cost - Find out if there is "Step Therapy" (other medicines required before you can try these)   For Weight Loss / Obesity only  Wegovy (same as Ozempic) weekly injection - start 0.25mg  weekly, 1 dose per pen, single use, auto-injector  2. Saxenda - DAILY injection - start 0.6mg  injection DAILY, you can increase the dose by 1 notch or 0.6 mg per week, if you don't tolerate a dose, can reduce it the next day.  3. Contrave - oral medication, appetite suppression has wellbutrin/bupropion and naltrexone in it and it can also help with appetite, it is ordered through a speciality pharmacy.  4. Zepbound (Tirzepatide) - weekly injection start at 2.5 mg weekly, every 4 weeks we can adjust / increase. - Will order today and work on approval.  Future make sure your insurance has weight loss coverage  WEIGHT MANAGEMENT  Dr Dennard Nip  [facility] [place], [place] [address] Ph: [phone]    DUE for FASTING BLOOD WORK (no food or drink after midnight before the lab appointment, only water or coffee without cream/sugar on the morning of)  SCHEDULE "Lab Only" visit in the morning at the clinic for lab draw in 3 MONTHS   - Make sure Lab Only appointment is at about 1 week before your next appointment, so that results will be available  For Lab Results, once available within 2-3 days of blood draw, you can can log in to MyChart online to view your results and a brief explanation. Also, we can discuss results at next follow-up visit.   Please schedule a Follow-up Appointment to: No follow-ups on file.  If you have any other questions or concerns, please feel  free to call the office or send a message through [place]. You may also schedule an earlier appointment if necessary.  Additionally, you may be receiving a survey about your experience at our office within a few days to 1 week by e-mail or mail. We value your feedback.  [name], DO [place]

## 2022-07-18 ENCOUNTER — Encounter: Payer: Self-pay | Admitting: Family Medicine

## 2022-07-18 ENCOUNTER — Other Ambulatory Visit: Payer: Self-pay | Admitting: Family Medicine

## 2022-07-18 DIAGNOSIS — G4733 Obstructive sleep apnea (adult) (pediatric): Secondary | ICD-10-CM

## 2022-07-18 DIAGNOSIS — E559 Vitamin D deficiency, unspecified: Secondary | ICD-10-CM

## 2022-07-18 DIAGNOSIS — I10 Essential (primary) hypertension: Secondary | ICD-10-CM

## 2022-07-18 DIAGNOSIS — Z0000 Encounter for general adult medical examination without abnormal findings: Secondary | ICD-10-CM

## 2022-07-18 NOTE — Assessment & Plan Note (Signed)
Mild elevated BP. Improve manual repeat Also secondary component w/ OSA - Home BP readings controlled Off Amlodipine (Swelling)   Plan:  Continue HCTZ 25mg  daily Encourage improved lifestyle - low sodium diet, regular exercise Continue to monitor BP outside office, bring readings to next visit, if persistently >140/90 or new symptoms notify office sooner  Emphasis on weight loss now w/ therapy Consider 2nd agent ARB if indicated

## 2022-07-18 NOTE — Assessment & Plan Note (Addendum)
Persistent abnormal weight gain, BMI 54 Unable to effectively lose weight with lifestyle Start Zepbound 2.5mg  weekly GLP/GIP injection for weight loss Encourage keep improving lifestyle diet exercise

## 2022-07-18 NOTE — Assessment & Plan Note (Signed)
Well controlled, chronic OSA on CPAP - Good adherence to CPAP nightly - Continue current CPAP therapy, patient seems to be benefiting from therapy  

## 2022-07-27 ENCOUNTER — Telehealth: Payer: Self-pay | Admitting: Family Medicine

## 2022-07-27 NOTE — Telephone Encounter (Signed)
Called patient. I advised him that Zepbound is only injectable that is preferred on insurance and covered. I advised him that he may contact his pharmacy check on Zepbound 5mg  dose or other pharmacies to see if he can find any availability of 2.5 or 5mg , and we can re order, or other option is Contrave oral, can do 7 day sample or rx to [facility] mail order $99 per month  [name], DO [facility] 07/27/2022, 11:37 AM

## 2022-07-27 NOTE — Telephone Encounter (Signed)
Patient called in says pharmacy is out of Zepbound. Please call back to discuss alternatives.

## 2022-08-11 ENCOUNTER — Other Ambulatory Visit: Payer: Self-pay

## 2022-08-11 DIAGNOSIS — I10 Essential (primary) hypertension: Secondary | ICD-10-CM

## 2022-08-11 MED ORDER — HYDROCHLOROTHIAZIDE 25 MG PO TABS: 25.0000 mg | ORAL_TABLET | Freq: Every day | ORAL | 2 refills | Status: DC

## 2022-08-18 ENCOUNTER — Encounter: Payer: Self-pay | Admitting: Family Medicine

## 2022-08-18 MED ORDER — ZEPBOUND 5 MG/0.5ML ~~LOC~~ SOAJ: 5.0000 mg | SUBCUTANEOUS | 0 refills | Status: DC

## 2022-08-20 MED ORDER — ZEPBOUND 5 MG/0.5ML ~~LOC~~ SOAJ: 5.0000 mg | SUBCUTANEOUS | 0 refills | Status: DC

## 2022-08-20 NOTE — Addendum Note (Signed)
Addended by: Smitty Cords on: 08/20/2022 05:10 PM   Modules accepted: Orders

## 2022-08-21 MED ORDER — ZEPBOUND 5 MG/0.5ML ~~LOC~~ SOAJ: 5.0000 mg | SUBCUTANEOUS | 0 refills | Status: DC

## 2022-08-21 NOTE — Addendum Note (Signed)
Addended by: Smitty Cords on: 08/21/2022 05:12 PM   Modules accepted: Orders

## 2022-08-25 ENCOUNTER — Other Ambulatory Visit: Payer: Self-pay | Admitting: Family Medicine

## 2022-08-25 NOTE — Telephone Encounter (Signed)
Unable to refill per protocol, Rx expired. Discontinued 08/18/22, dose change.  Requested Prescriptions  Pending Prescriptions Disp Refills   ZEPBOUND 2.5 MG/0.5ML Pen [Pharmacy Med Name: ZEPBOUND 2.5MG /0.5ML INJ (4PF PENS)] 2 mL 0    Sig: ADMINISTER 2.5 MG UNDER THE SKIN 1 TIME A WEEK     Off-Protocol Failed - 08/25/2022  3:34 AM      Failed - Medication not assigned to a protocol, review manually.      Passed - Valid encounter within last 12 months    Recent Outpatient Visits           1 month ago Morbid obesity with BMI of 50.0-59.9, adult [facility])   [facility] [place], Netta Neat, DO   1 year ago Annual physical exam   [facility] Smitty Cords, DO   2 years ago Breast pain, left   [facility] Smitty Cords, DO   2 years ago Essential hypertension   [place] Smitty Cords, DO   2 years ago Essential hypertension   [place] [facility] Smitty Cords, DO       Future Appointments             In 1 month Althea Charon, Netta Neat, DO [facility], [facility]

## 2022-09-21 MED ORDER — ZEPBOUND 5 MG/0.5ML ~~LOC~~ SOAJ: 5.0000 mg | SUBCUTANEOUS | 0 refills | Status: DC

## 2022-09-21 NOTE — Addendum Note (Signed)
Addended by: Smitty Cords on: 09/21/2022 07:09 PM   Modules accepted: Orders

## 2022-09-30 NOTE — Addendum Note (Signed)
Addended by: Smitty Cords on: 09/30/2022 06:51 PM   Modules accepted: Orders

## 2022-10-12 ENCOUNTER — Other Ambulatory Visit: Payer: BC Managed Care – PPO

## 2022-10-12 DIAGNOSIS — Z0000 Encounter for general adult medical examination without abnormal findings: Secondary | ICD-10-CM

## 2022-10-12 DIAGNOSIS — E559 Vitamin D deficiency, unspecified: Secondary | ICD-10-CM | POA: Diagnosis not present

## 2022-10-12 DIAGNOSIS — G4733 Obstructive sleep apnea (adult) (pediatric): Secondary | ICD-10-CM | POA: Diagnosis not present

## 2022-10-12 DIAGNOSIS — I10 Essential (primary) hypertension: Secondary | ICD-10-CM

## 2022-10-13 LAB — COMPLETE METABOLIC PANEL WITH GFR
AG Ratio: 1.7 (calc) (ref 1.0–2.5)
ALT: 26 U/L (ref 9–46)
AST: 17 U/L (ref 10–40)
Albumin: 4 g/dL (ref 3.6–5.1)
Alkaline phosphatase (APISO): 46 U/L (ref 36–130)
BUN: 13 mg/dL (ref 7–25)
CO2: 27 mmol/L (ref 20–32)
Calcium: 8.9 mg/dL (ref 8.6–10.3)
Chloride: 102 mmol/L (ref 98–110)
Creat: 0.87 mg/dL (ref 0.60–1.29)
Globulin: 2.4 g/dL (calc) (ref 1.9–3.7)
Glucose, Bld: 70 mg/dL (ref 65–99)
Potassium: 3.9 mmol/L (ref 3.5–5.3)
Sodium: 140 mmol/L (ref 135–146)
Total Bilirubin: 0.4 mg/dL (ref 0.2–1.2)
Total Protein: 6.4 g/dL (ref 6.1–8.1)
eGFR: 112 mL/min/{1.73_m2} (ref >=60)

## 2022-10-13 LAB — LIPID PANEL
Cholesterol: 127 mg/dL (ref <200)
HDL: 34 mg/dL — ABNORMAL LOW (ref >=40)
LDL Cholesterol (Calc): 74 mg/dL (calc)
Non-HDL Cholesterol (Calc): 93 mg/dL (calc) (ref <130)
Total CHOL/HDL Ratio: 3.7 (calc) (ref <5.0)
Triglycerides: 111 mg/dL (ref <150)

## 2022-10-13 LAB — VITAMIN D 25 HYDROXY (VIT D DEFICIENCY, FRACTURES): Vit D, 25-Hydroxy: 57 ng/mL (ref 30–100)

## 2022-10-13 LAB — HEMOGLOBIN A1C
Hgb A1c MFr Bld: 5.7 % of total Hgb — ABNORMAL HIGH (ref <5.7)
Mean Plasma Glucose: 117 mg/dL
eAG (mmol/L): 6.5 mmol/L

## 2022-10-13 LAB — TSH: TSH: 3.29 mIU/L (ref 0.40–4.50)

## 2022-10-19 ENCOUNTER — Ambulatory Visit (INDEPENDENT_AMBULATORY_CARE_PROVIDER_SITE_OTHER): Payer: BC Managed Care – PPO | Admitting: Family Medicine

## 2022-10-19 ENCOUNTER — Encounter: Payer: Self-pay | Admitting: Family Medicine

## 2022-10-19 VITALS — BP 138/86 | HR 80 | Temp 98.2°F | Resp 18 | Ht 69.0 in | Wt 341.5 lb

## 2022-10-19 DIAGNOSIS — G4733 Obstructive sleep apnea (adult) (pediatric): Secondary | ICD-10-CM | POA: Diagnosis not present

## 2022-10-19 DIAGNOSIS — I10 Essential (primary) hypertension: Secondary | ICD-10-CM | POA: Diagnosis not present

## 2022-10-19 DIAGNOSIS — Z0000 Encounter for general adult medical examination without abnormal findings: Secondary | ICD-10-CM | POA: Diagnosis not present

## 2022-10-19 DIAGNOSIS — Z6841 Body Mass Index (BMI) 40.0 and over, adult: Secondary | ICD-10-CM

## 2022-10-19 MED ORDER — ZEPBOUND 7.5 MG/0.5ML ~~LOC~~ SOAJ: 7.5000 mg | SUBCUTANEOUS | 0 refills | Status: DC

## 2022-10-19 NOTE — Progress Notes (Signed)
Subjective:    Patient ID: Alan Duncan, male    DOB: 1980-08-10, 42 y.o.   MRN: [id number]  Alan Duncan is a 42 y.o. male presenting on 10/19/2022 for Annual Exam   HPI  Here for Annual Physical and Lab Review  Morbid Obesity BMI >50 Weight down 368 lbs down to 341 lbs Successful weight loss so far on Zepbound 5mg  weekly, has completed 11 doses, within 3 month span, weight loss down 27 lbs already since March. He was on 2.5mg  weekly x 4 weeks then pause for 1 month due to back order, then now continues on 5mg  weekly. He is doing well back on day shift Mon - Fri He is improving his appetite diet now on med   CHRONIC HTN Improved BP with weight loss Off Amlodipine Current Meds - hydrochlorothiazide 25mg  daily He admits side effect swelling edema worse in evening in legs. Lifestyle: See above Denies CP, dyspnea, HA, dizziness / lightheadedness  Elevated A1c Slightly elevated A1c 5.7, similar to 5.6 1 yr ago He is on Zepbound. No concern for Pre Diabetes   OSA, on CPAP - Patient reports prior history of dx OSA and on CPAP for years, prior to treatment initial symptoms were snoring, daytime sleepiness and fatigue, has had several sleep studies in the past. - Today reports that sleep apnea is well controlled. He uses the CPAP machine every night. Tolerates the machine well, and thinks that sleeps better with it and feels good. No new concerns or symptoms.   Health Maintenance: updated     10/19/2022    3:27 PM 07/18/2022    8:54 AM 05/07/2021    8:55 AM  Depression screen PHQ 2/9  Decreased Interest 0 0 0  Down, Depressed, Hopeless 0 0 0  PHQ - 2 Score 0 0 0  Altered sleeping 0  0  Tired, decreased energy 0  2  Change in appetite 0  1  Feeling bad or failure about yourself  0  0  Trouble concentrating 0  0  Moving slowly or fidgety/restless 0  0  Suicidal thoughts 0  0  PHQ-9 Score 0  3  Difficult doing work/chores Not difficult at all  Not difficult at all     Past Medical History:  Diagnosis Date   Sleep apnea    No past surgical history on file. Social History   Socioeconomic History   Marital status: Married    Spouse name: Not on file   Number of children: Not on file   Years of education: Not on file   Highest education level: Bachelor's degree (e.g., BA, AB, BS)  Occupational History   Not on file  Tobacco Use   Smoking status: Never    Passive exposure: Never   Smokeless tobacco: Never  Vaping Use   Vaping Use: Never used  Substance and Sexual Activity   Alcohol use: Yes    Comment: social   Drug use: No   Sexual activity: Not on file  Other Topics Concern   Not on file  Social History Narrative   Not on file   Social Determinants of Health   Financial Resource Strain: Low Risk  (07/14/2022)   Overall Financial Resource Strain (CARDIA)    Difficulty of Paying Living Expenses: Not hard at all  Food Insecurity: No Food Insecurity (07/14/2022)   Hunger Vital Sign    Worried About Running Out of Food in the Last Year: Never true    Ran Out  of Food in the Last Year: Never true  Transportation Needs: No Transportation Needs (07/14/2022)   PRAPARE - Administrator, Civil Service (Medical): No    Lack of Transportation (Non-Medical): No  Physical Activity: Sufficiently Active (07/14/2022)   Exercise Vital Sign    Days of Exercise per Week: 4 days    Minutes of Exercise per Session: 40 min  Stress: No Stress Concern Present (07/14/2022)   [employer] of Occupational Health - Occupational Stress Questionnaire    Feeling of Stress : Not at all  Social Connections: Moderately Integrated (07/14/2022)   Social Connection and Isolation Panel [NHANES]    Frequency of Communication with Friends and Family: More than three times a week    Frequency of Social Gatherings with Friends and Family: Twice a week    Attends Religious Services: Never    Database administrator or Organizations: Yes    Attends Museum/gallery exhibitions officer: 1 to 4 times per year    Marital Status: Married  Catering manager Violence: Not on file   Family History  Problem Relation Age of Onset   Hypertension Father    Heart disease Maternal Grandmother    Heart disease Maternal Grandfather    Kidney failure Maternal Grandfather    Cancer Maternal Grandfather    Hypertension Paternal Grandmother    Prostate cancer Neg Hx    Colon cancer Neg Hx    Breast cancer Neg Hx    Current Outpatient Medications on File Prior to Visit  Medication Sig   clomiPHENE (CLOMID) 50 MG tablet Take 0.5 tablets (25 mg total) by mouth daily.   hydrochlorothiazide (HYDRODIURIL) 25 MG tablet Take 1 tablet (25 mg total) by mouth daily.   sildenafil (REVATIO) 20 MG tablet Take 1-5 tablets by mouth as needed prior to intercourse (Patient not taking: Reported on 10/19/2022)   No current facility-administered medications on file prior to visit.    Review of Systems  Constitutional:  Negative for activity change, appetite change, chills, diaphoresis, fatigue and fever.  HENT:  Negative for congestion and hearing loss.   Eyes:  Negative for visual disturbance.  Respiratory:  Negative for cough, chest tightness, shortness of breath and wheezing.   Cardiovascular:  Negative for chest pain, palpitations and leg swelling.  Gastrointestinal:  Negative for abdominal pain, constipation, diarrhea, nausea and vomiting.  Genitourinary:  Negative for dysuria, frequency and hematuria.  Musculoskeletal:  Negative for arthralgias and neck pain.  Skin:  Negative for rash.  Neurological:  Negative for dizziness, weakness, light-headedness, numbness and headaches.  Hematological:  Negative for adenopathy.  Psychiatric/Behavioral:  Negative for behavioral problems, dysphoric mood and sleep disturbance.    Per HPI unless specifically indicated above     Objective:    BP 138/86 (BP Location: Left Arm, Patient Position: Sitting, Cuff Size: Large)   Pulse 80    Temp 98.2 F (36.8 C) (Oral)   Resp 18   Ht 5\' 9"  (1.753 m)   Wt (!) 341 lb 8 oz (154.9 kg)   SpO2 97%   BMI 50.43 kg/m   Wt Readings from Last 3 Encounters:  10/19/22 (!) 341 lb 8 oz (154.9 kg)  07/17/22 (!) 368 lb (166.9 kg)  10/22/21 (!) 353 lb (160.1 kg)    Physical Exam Vitals and nursing note reviewed.  Constitutional:      General: He is not in acute distress.    Appearance: He is well-developed. He is obese. He is not diaphoretic.  Comments: Well-appearing, comfortable, cooperative  HENT:     Head: Normocephalic and atraumatic.  Eyes:     General:        Right eye: No discharge.        Left eye: No discharge.     Conjunctiva/sclera: Conjunctivae normal.     Pupils: Pupils are equal, round, and reactive to light.  Neck:     Thyroid: No thyromegaly.     Vascular: No carotid bruit.  Cardiovascular:     Rate and Rhythm: Normal rate and regular rhythm.     Pulses: Normal pulses.     Heart sounds: Normal heart sounds. No murmur heard. Pulmonary:     Effort: Pulmonary effort is normal. No respiratory distress.     Breath sounds: Normal breath sounds. No wheezing or rales.  Abdominal:     General: Bowel sounds are normal. There is no distension.     Palpations: Abdomen is soft. There is no mass.     Tenderness: There is no abdominal tenderness.  Musculoskeletal:        General: No tenderness. Normal range of motion.     Cervical back: Normal range of motion and neck supple.     Right lower leg: No edema.     Left lower leg: No edema.     Comments: Upper / Lower Extremities: - Normal muscle tone, strength bilateral upper extremities 5/5, lower extremities 5/5  Lymphadenopathy:     Cervical: No cervical adenopathy.  Skin:    General: Skin is warm and dry.     Findings: No erythema or rash.  Neurological:     Mental Status: He is alert and oriented to person, place, and time.     Comments: Distal sensation intact to light touch all extremities  Psychiatric:         Mood and Affect: Mood normal.        Behavior: Behavior normal.        Thought Content: Thought content normal.     Comments: Well groomed, good eye contact, normal speech and thoughts      Results for orders placed or performed in visit on 10/12/22  VITAMIN D 25 Hydroxy (Vit-D Deficiency, Fractures)  Result Value Ref Range   Vit D, 25-Hydroxy 57 30 - 100 ng/mL  TSH  Result Value Ref Range   TSH 3.29 0.40 - 4.50 mIU/L  Hemoglobin A1c  Result Value Ref Range   Hgb A1c MFr Bld 5.7 (H) <5.7 % of total Hgb   Mean Plasma Glucose 117 mg/dL   eAG (mmol/L) 6.5 mmol/L  Lipid panel  Result Value Ref Range   Cholesterol 127 <200 mg/dL   HDL 34 (L) > OR = 40 mg/dL   Triglycerides 161 <096 mg/dL   LDL Cholesterol (Calc) 74 mg/dL (calc)   Total CHOL/HDL Ratio 3.7 <5.0 (calc)   Non-HDL Cholesterol (Calc) 93 <045 mg/dL (calc)  COMPLETE METABOLIC PANEL WITH GFR  Result Value Ref Range   Glucose, Bld 70 65 - 99 mg/dL   BUN 13 7 - 25 mg/dL   Creat 4.09 8.11 - 9.14 mg/dL   eGFR 782 > OR = 60 NF/AOZ/3.08M5   BUN/Creatinine Ratio SEE NOTE: 6 - 22 (calc)   Sodium 140 135 - 146 mmol/L   Potassium 3.9 3.5 - 5.3 mmol/L   Chloride 102 98 - 110 mmol/L   CO2 27 20 - 32 mmol/L   Calcium 8.9 8.6 - 10.3 mg/dL   Total Protein 6.4 6.1 -  8.1 g/dL   Albumin 4.0 3.6 - 5.1 g/dL   Globulin 2.4 1.9 - 3.7 g/dL (calc)   AG Ratio 1.7 1.0 - 2.5 (calc)   Total Bilirubin 0.4 0.2 - 1.2 mg/dL   Alkaline phosphatase (APISO) 46 36 - 130 U/L   AST 17 10 - 40 U/L   ALT 26 9 - 46 U/L      Assessment & Plan:   Problem List Items Addressed This Visit     Essential hypertension    Improved with weight loss Also secondary component w/ OSA - Home BP readings controlled Off Amlodipine (Swelling)   Plan:  Continue HCTZ 25mg  daily Encourage improved lifestyle - low sodium diet, regular exercise Continue to monitor BP outside office, bring readings to next visit, if persistently >140/90 or new symptoms notify  office sooner  Consider 2nd agent ARB if indicated      Morbid obesity with BMI of 50.0-59.9, adult (HCC)    Dramatic improvement wt down 27 lbs in 3 months on Zepbound Improved weight loss overall Dose increase Zepbound from 5mg  weekly inj to 7.5mg  weekly, will order 84 day supply Future dose increase as indicated 7.5 up to 10 in future can do 90 day Encourage keep improving lifestyle diet exercise      Relevant Medications   ZEPBOUND 7.5 MG/0.5ML Pen   OSA on CPAP    Well controlled, chronic OSA on CPAP - Good adherence to CPAP nightly - Continue current CPAP therapy, patient seems to be benefiting from therapy       Other Visit Diagnoses     Annual physical exam    -  Primary       Updated Health Maintenance information Reviewed recent lab results with patient Encouraged improvement to lifestyle with diet and exercise Goal of weight loss  Labs are good, cholesterol is excellent.  A1c borderline pre diabetic 5.7, on this medicine  Ideally we can keep adjusting every 3 months goal to get to at least Zepbound 10mg  weekly, we can consider 12 and 15 if needed in the future.  Let me know in 3 months - Oct 2025 how you are doing and request a new order. No apt needed unless insurance declines the new order. Then we will let you know.  No orders of the defined types were placed in this encounter.    Meds ordered this encounter  Medications   ZEPBOUND 7.5 MG/0.5ML Pen    Sig: Inject 7.5 mg into the skin once a week.    Dispense:  6 mL    Refill:  0      Follow up plan: Return in about 6 months (around 04/21/2023) for 6 month PreDM A1c, Weight Check, OSA.  [name], DO [facility] 10/19/2022, 3:46 PM

## 2022-10-19 NOTE — Assessment & Plan Note (Signed)
Dramatic improvement wt down 27 lbs in 3 months on Zepbound Improved weight loss overall Dose increase Zepbound from 5mg  weekly inj to 7.5mg  weekly, will order 84 day supply Future dose increase as indicated 7.5 up to 10 in future can do 90 day Encourage keep improving lifestyle diet exercise

## 2022-10-19 NOTE — Patient Instructions (Addendum)
Thank you for coming to the office today.  Labs are good, cholesterol is excellent.  A1c borderline pre diabetic 5.7, on this medicine, I am not worried at all.  Great job! Keep on Zepbound 7.5mg . we attempted to order 90 day to local pharmacy. If we need to update to mail order or other changes, l;et me know.  Ideally we can keep adjusting every 3 months goal to get to at least Zepbound 10mg  weekly, we can consider 12 and 15 if needed in the future.  Let me know in 3 months - Oct 2025 how you are doing and request a new order. No apt needed unless insurance declines the new order. Then we will let you know.   Please schedule a Follow-up Appointment to: Return in about 6 months (around 04/21/2023) for 6 month PreDM A1c, Weight Check, OSA.  If you have any other questions or concerns, please feel free to call the office or send a message through MyChart. You may also schedule an earlier appointment if necessary.  Additionally, you may be receiving a survey about your experience at our office within a few days to 1 week by e-mail or mail. We value your feedback.  [name], DO [facility], [place]

## 2022-10-19 NOTE — Assessment & Plan Note (Addendum)
Improved with weight loss Also secondary component w/ OSA - Home BP readings controlled Off Amlodipine (Swelling)   Plan:  Continue HCTZ 25mg  daily Encourage improved lifestyle - low sodium diet, regular exercise Continue to monitor BP outside office, bring readings to next visit, if persistently >140/90 or new symptoms notify office sooner  Consider 2nd agent ARB if indicated

## 2022-10-19 NOTE — Assessment & Plan Note (Signed)
Well controlled, chronic OSA on CPAP - Good adherence to CPAP nightly - Continue current CPAP therapy, patient seems to be benefiting from therapy  

## 2022-10-23 ENCOUNTER — Other Ambulatory Visit: Payer: BC Managed Care – PPO

## 2022-10-25 ENCOUNTER — Other Ambulatory Visit: Payer: Self-pay | Admitting: Family Medicine

## 2022-10-26 NOTE — Telephone Encounter (Signed)
Requested Prescriptions  Refused Prescriptions Disp Refills   ZEPBOUND 5 MG/0.5ML Pen [Pharmacy Med Name: ZEPBOUND 5MG /0.5ML INJ (4 PF PENS)] 2 mL 0    Sig: ADMINISTER 5 MG UNDER THE SKIN 1 TIME A WEEK     Off-Protocol Failed - 10/25/2022  3:32 AM      Failed - Medication not assigned to a protocol, review manually.      Passed - Valid encounter within last 12 months    Recent Outpatient Visits           1 week ago Annual physical exam   [place] [facility], Netta Neat, DO   3 months ago Morbid obesity with BMI of 50.0-59.9, adult [facility])   [place] [facility] Althea Charon, Netta Neat, DO   1 year ago Annual physical exam   [place] [facility] Smitty Cords, DO   2 years ago Breast pain, left   [place] Smitty Cords, DO   2 years ago Essential hypertension   [place] [facility] Smitty Cords, DO       Future Appointments             In 1 month [name], MD [facility] [place]   In 5 months Althea Charon, Netta Neat, DO  [facility], [facility]

## 2022-10-27 ENCOUNTER — Ambulatory Visit: Payer: BC Managed Care – PPO | Admitting: Urology

## 2022-11-13 ENCOUNTER — Other Ambulatory Visit: Payer: Self-pay

## 2022-11-13 DIAGNOSIS — E291 Testicular hypofunction: Secondary | ICD-10-CM

## 2022-11-16 ENCOUNTER — Other Ambulatory Visit: Payer: BC Managed Care – PPO

## 2022-11-16 DIAGNOSIS — E291 Testicular hypofunction: Secondary | ICD-10-CM | POA: Diagnosis not present

## 2022-11-25 ENCOUNTER — Encounter: Payer: Self-pay | Admitting: Family Medicine

## 2022-11-25 MED ORDER — TIRZEPATIDE-WEIGHT MANAGEMENT 10 MG/0.5ML ~~LOC~~ SOAJ: 10.0000 mg | SUBCUTANEOUS | 0 refills | Status: DC

## 2022-12-02 ENCOUNTER — Ambulatory Visit (INDEPENDENT_AMBULATORY_CARE_PROVIDER_SITE_OTHER): Payer: BC Managed Care – PPO | Admitting: Urology

## 2022-12-02 VITALS — BP 135/80 | HR 73 | Ht 69.0 in | Wt 342.2 lb

## 2022-12-02 DIAGNOSIS — E291 Testicular hypofunction: Secondary | ICD-10-CM

## 2022-12-02 DIAGNOSIS — N521 Erectile dysfunction due to diseases classified elsewhere: Secondary | ICD-10-CM

## 2022-12-02 MED ORDER — CLOMIPHENE CITRATE 50 MG PO TABS
25.0000 mg | ORAL_TABLET | Freq: Every day | ORAL | 6 refills | Status: DC
Start: 2022-12-02 — End: 2023-10-21

## 2022-12-02 NOTE — Progress Notes (Signed)
Alan Duncan,acting as a scribe for Alan Scotland, MD.,have documented all relevant documentation on the behalf of Alan Scotland, MD,as directed by  Alan Scotland, MD while in the presence of Alan Scotland, MD.  12/02/2022 11:21 AM   Alan Duncan, Alan Duncan [id number]  Referring provider: Smitty Cords, DO [address] [place],  [place] [address]  Chief Complaint  Patient presents with   Hypogonadism    HPI: 42 year-old male with a personal history of hypogonadism, erectile dysfunction, and morbid obesity.  He returns today for routine annual follow up.   He initially presented with a testosterone of only 58 in 04/2021. He was started on Clomid and his testosterone increased to 181, which was still below normal, but he overall felt clinically improved. As such, we elected to continue Clomid, 25 mg daily.   His most recent testosterone on 11/16/2022 was 227. His PSA is still stable at 0.8. His LFTs are within the normal limit along with his H&H.  Today, he reports feeling better overall, with noticeable improvements in his symptoms when his testosterone levels are higher.  He also notes that his symptoms fluctuate, feeling better on some days than others. He has been working on weight loss and reports that his new medication is helping.   Home Medications:  Allergies as of 12/02/2022   No Known Allergies      Medication List        Accurate as of December 02, 2022 11:21 AM. If you have any questions, ask your nurse or doctor.          STOP taking these medications    sildenafil 20 MG tablet Commonly known as: REVATIO       TAKE these medications    clomiPHENE 50 MG tablet Commonly known as: CLOMID Take 0.5 tablets (25 mg total) by mouth daily.   hydrochlorothiazide 25 MG tablet Commonly known as: HYDRODIURIL Take 1 tablet (25 mg total) by mouth daily.   tirzepatide 10 MG/0.5ML Pen Commonly known as: ZEPBOUND Inject 10 mg into the skin once  a week.        Family History: Family History  Problem Relation Age of Onset   Hypertension Father    Heart disease Maternal Grandmother    Heart disease Maternal Grandfather    Kidney failure Maternal Grandfather    Cancer Maternal Grandfather    Hypertension Paternal Grandmother    Prostate cancer Neg Hx    Colon cancer Neg Hx    Breast cancer Neg Hx     Social History:  reports that he has never smoked. He has never been exposed to tobacco smoke. He has never used smokeless tobacco. He reports current alcohol use. He reports that he does not use drugs.   Physical Exam: BP 135/80   Pulse 73   Ht 5\' 9"  (1.753 m)   Wt (!) 342 lb 4 oz (155.2 kg)   BMI 50.54 kg/m   Constitutional:  Alert and oriented, No acute distress. HEENT: [place] AT, moist mucus membranes.  Trachea midline, no masses. Neurologic: Grossly intact, no focal deficits, moving all 4 extremities. Psychiatric: Normal mood and affect.   Assessment & Plan:    1. Hypogonadism - Testosterone continues to rise on Clomid 25 mg daily and he may continue this as his surveillance labs are all within normal limits - Monitor testosterone levels, especially if significant weight loss occurs. -Symptomatically doing well -Continue to encourage weight loss, improvement in overall metabolic syndrome  should also boost his testosterone levels, discussed the pathophysiology  2. Erectile dysfunction - He is no longer having issues with this - Discontinue sildenafil    Return in about 1 year (around 12/02/2023) for repeat PSA, LFT, testosterone, H&H.  I have reviewed the above documentation for accuracy and completeness, and I agree with the above.   Alan Scotland, MD    [facility] [address] [place], [place] [address] [phone]

## 2022-12-03 ENCOUNTER — Other Ambulatory Visit: Payer: Self-pay

## 2022-12-03 MED ORDER — TIRZEPATIDE-WEIGHT MANAGEMENT 10 MG/0.5ML ~~LOC~~ SOAJ
10.0000 mg | SUBCUTANEOUS | 0 refills | Status: DC
Start: 2022-12-03 — End: 2022-12-22

## 2022-12-03 NOTE — Telephone Encounter (Signed)
Due to pharmacy never receiving it

## 2022-12-21 ENCOUNTER — Other Ambulatory Visit: Payer: Self-pay | Admitting: Internal Medicine

## 2022-12-22 MED ORDER — ZEPBOUND 10 MG/0.5ML ~~LOC~~ SOAJ
10.0000 mg | SUBCUTANEOUS | 1 refills | Status: AC
Start: 2022-12-22 — End: ?

## 2023-01-02 ENCOUNTER — Other Ambulatory Visit: Payer: Self-pay | Admitting: Internal Medicine

## 2023-01-04 NOTE — Telephone Encounter (Signed)
Request was refilled 12/22/22 for 6 ml and 1 refill. Duplicate request.  Requested Prescriptions  Pending Prescriptions Disp Refills   ZEPBOUND 10 MG/0.5ML Pen [Pharmacy Med Name: ZEPBOUND 10MG /0.5ML INJ (4 PF PENS)] 2 mL     Sig: ADMINISTER 10 MG UNDER THE SKIN 1 TIME A WEEK     Off-Protocol Failed - 01/02/2023  7:56 AM      Failed - Medication not assigned to a protocol, review manually.      Passed - Valid encounter within last 12 months    Recent Outpatient Visits           2 months ago Annual physical exam   [place], Netta Neat, DO   5 months ago Morbid obesity with BMI of 50.0-59.9, adult [facility])   [facility] Althea Charon, Netta Neat, DO   1 year ago Annual physical exam   [place] [facility] Smitty Cords, DO   2 years ago Breast pain, left   [place] [facility] Smitty Cords, DO   2 years ago Essential hypertension   [facility] Smitty Cords, DO       Future Appointments             In 3 months Althea Charon, Netta Neat, DO [facility] [place], [place]   In 11 months [name], MD [facility]

## 2023-01-18 ENCOUNTER — Encounter: Payer: Self-pay | Admitting: Family Medicine

## 2023-01-20 MED ORDER — ZEPBOUND 15 MG/0.5ML ~~LOC~~ SOAJ
15.0000 mg | SUBCUTANEOUS | 1 refills | Status: DC
Start: 2023-01-20 — End: 2023-04-23

## 2023-03-04 NOTE — Progress Notes (Signed)
Your request has been approved CaseId:[id number];Status:Approved;Review Type:Prior Auth;Coverage Start Date:02/02/2023;Coverage End Date:10/30/2023;  (KeyDarlyn Chamber) PA Case ID #: [id number] Need Help? Call us at [phone] Outcome Approved today by [employer] 2017 CaseId:[id number];Status:Approved;Review Type:Prior Auth;Coverage Start Date:02/02/2023;Coverage End Date:10/30/2023; Authorization Expiration Date: 10/29/2023

## 2023-04-20 ENCOUNTER — Other Ambulatory Visit: Payer: Self-pay | Admitting: Family Medicine

## 2023-04-20 DIAGNOSIS — I10 Essential (primary) hypertension: Secondary | ICD-10-CM

## 2023-04-23 ENCOUNTER — Ambulatory Visit: Payer: BC Managed Care – PPO | Admitting: Family Medicine

## 2023-04-23 ENCOUNTER — Encounter: Payer: Self-pay | Admitting: Family Medicine

## 2023-04-23 VITALS — BP 130/80 | HR 78 | Resp 96 | Ht 69.0 in | Wt 306.0 lb

## 2023-04-23 DIAGNOSIS — G4733 Obstructive sleep apnea (adult) (pediatric): Secondary | ICD-10-CM

## 2023-04-23 DIAGNOSIS — Z131 Encounter for screening for diabetes mellitus: Secondary | ICD-10-CM | POA: Diagnosis not present

## 2023-04-23 DIAGNOSIS — I10 Essential (primary) hypertension: Secondary | ICD-10-CM

## 2023-04-23 DIAGNOSIS — Z6841 Body Mass Index (BMI) 40.0 and over, adult: Secondary | ICD-10-CM

## 2023-04-23 LAB — POCT GLYCOSYLATED HEMOGLOBIN (HGB A1C): Hemoglobin A1C: 5.1 % (ref 4.0–5.6)

## 2023-04-23 MED ORDER — HYDROCHLOROTHIAZIDE 25 MG PO TABS: 25.0000 mg | ORAL_TABLET | Freq: Every day | ORAL | 3 refills | Status: DC

## 2023-04-23 MED ORDER — ZEPBOUND 15 MG/0.5ML ~~LOC~~ SOAJ: 15.0000 mg | SUBCUTANEOUS | 1 refills | Status: DC

## 2023-04-23 NOTE — Progress Notes (Signed)
 Subjective:    Patient ID: Alan Duncan, male    DOB: 11/08/1980, 43 y.o.   MRN: [id number]  Alan Duncan is a 43 y.o. male presenting on 04/23/2023 for Hypertension   HPI  Discussed the use of AI scribe software for clinical note transcription with the patient, who gave verbal consent to proceed.  History of Present Illness     Morbid Obesity BMI >45 Weight down 342 lbs down to 306 lbs has been on a successful weight loss journey, losing approximately 40 pounds over the past six months. He reports feeling much better overall, with notable improvements in knee and overall body comfort. He is improving his appetite diet now on med On Zepbound  15mg  weekly, for past 3 months, possible miss dose in future if pharmacy not able to fill, will need new authorization   CHRONIC HTN Improved BP with weight loss Off Amlodipine  Current Meds - hydrochlorothiazide  25mg  daily He admits side effect swelling edema worse in evening in legs. Lifestyle: See above Denies CP, dyspnea, HA, dizziness / lightheadedness     OSA, on CPAP - Patient reports prior history of dx OSA and on CPAP for years, prior to treatment initial symptoms were snoring, daytime sleepiness and fatigue, has had several sleep studies in the past. - Today reports that sleep apnea is well controlled. He uses the CPAP machine every night. Tolerates the machine well, and thinks that sleeps better with it and feels good. No new concerns or symptoms.        04/23/2023    2:44 PM 10/19/2022    3:27 PM 07/18/2022    8:54 AM  Depression screen PHQ 2/9  Decreased Interest 0 0 0  Down, Depressed, Hopeless 0 0 0  PHQ - 2 Score 0 0 0  Altered sleeping  0   Tired, decreased energy  0   Change in appetite  0   Feeling bad or failure about yourself   0   Trouble concentrating  0   Moving slowly or fidgety/restless  0   Suicidal thoughts  0   PHQ-9 Score  0   Difficult doing work/chores  Not difficult at all         04/23/2023    2:44 PM 10/19/2022    3:27 PM 05/07/2021    8:56 AM  GAD 7 : Generalized Anxiety Score  Nervous, Anxious, on Edge 0 0 0  Control/stop worrying 0 0 0  Worry too much - different things 0 0 0  Trouble relaxing 0 0 0  Restless 0 0 0  Easily annoyed or irritable 0 0 0  Afraid - awful might happen 0 0 0  Total GAD 7 Score 0 0 0  Anxiety Difficulty  Not difficult at all Not difficult at all    Social History   Tobacco Use   Smoking status: Never    Passive exposure: Never   Smokeless tobacco: Never  Vaping Use   Vaping status: Never Used  Substance Use Topics   Alcohol use: Yes    Comment: social   Drug use: No    Review of Systems Per HPI unless specifically indicated above     Objective:    BP 130/80   Pulse 78   Resp (!) 96   Ht 5' 9 (1.753 m)   Wt (!) 306 lb (138.8 kg)   BMI 45.19 kg/m   Wt Readings from Last 3 Encounters:  04/23/23 (!) 306 lb (138.8 kg)  12/02/22 ROLLEN)  342 lb 4 oz (155.2 kg)  10/19/22 (!) 341 lb 8 oz (154.9 kg)    Physical Exam Vitals and nursing note reviewed.  Constitutional:      General: He is not in acute distress.    Appearance: He is well-developed. He is obese. He is not diaphoretic.     Comments: Well-appearing, comfortable, cooperative  HENT:     Head: Normocephalic and atraumatic.  Eyes:     General:        Right eye: No discharge.        Left eye: No discharge.     Conjunctiva/sclera: Conjunctivae normal.  Neck:     Thyroid : No thyromegaly.  Cardiovascular:     Rate and Rhythm: Normal rate and regular rhythm.     Pulses: Normal pulses.     Heart sounds: Normal heart sounds. No murmur heard. Pulmonary:     Effort: Pulmonary effort is normal. No respiratory distress.     Breath sounds: Normal breath sounds. No wheezing or rales.  Musculoskeletal:        General: Normal range of motion.     Cervical back: Normal range of motion and neck supple.  Lymphadenopathy:     Cervical: No cervical adenopathy.  Skin:     General: Skin is warm and dry.     Findings: No erythema or rash.  Neurological:     Mental Status: He is alert and oriented to person, place, and time. Mental status is at baseline.  Psychiatric:        Behavior: Behavior normal.     Comments: Well groomed, good eye contact, normal speech and thoughts     Results for orders placed or performed in visit on 04/23/23  POCT HgB A1C   Collection Time: 04/23/23  2:48 PM  Result Value Ref Range   Hemoglobin A1C 5.1 4.0 - 5.6 %   HbA1c POC (<> result, manual entry)     HbA1c, POC (prediabetic range)     HbA1c, POC (controlled diabetic range)        Assessment & Plan:   Problem List Items Addressed This Visit     Essential hypertension   Relevant Medications   hydrochlorothiazide  (HYDRODIURIL ) 25 MG tablet   Morbid obesity with BMI of 45.0-49.9, adult (HCC) - Primary   Relevant Medications   ZEPBOUND  15 MG/0.5ML Pen   OSA on CPAP   Other Visit Diagnoses       Screening for diabetes mellitus (DM)       Relevant Orders   POCT HgB A1C (Completed)        Obesity BMI >45 Significant weight loss of 40 pounds over the past six months. BMI category decreased from 50s to 40s. -Continue current weight loss strategies lifestyle + Medication, re order Zepbound  15mg  weekly inj, had issue with lapse in coverage, we will try to renew authorization with new order.  -Resend prescription for Zepbound  15mg  for 90 days to [facility]. -Request team to send a new authorization or check if one is needed. -Advise patient to follow up with pharmacy and insurance for clarification.  Hypertension Blood pressure controlled on Hydrochlorothiazide . Discussed potential for dose reduction with continued weight loss. -Advise patient to monitor blood pressure at home. -If home readings are consistently less than 135/85, patient may consider reducing Hydrochlorothiazide  dose to half from 25mg  down to 12.5mg  -Reorder Hydrochlorothiazide  to ensure continuity  of therapy.  Follow-up Schedule appointment for six months from now (July 2025). -Order labs one week prior  to appointment. -Advise patient to contact office if there are any issues with obtaining Zepbound .      ____________________________________________________ Additional Rx Information (May be used for Prior Authorization if required)  Medication name and Strength: Zepbound  15 mg  Primary Diagnosis and ICD10 Code: Morbid Obesity BMI >45-49.9 (E66.01, Z68.42) Secondary Diagnosis and ICD10 Code: Hypertension (I10), OSA (G47.33) Previous Failed Medications N/a - this is continuation of therapy Quantity and Duration of New Medication: 6 mL for 84 days Additional Supporting Information: Continuation of therapy. Patient successful on 15mg  dose, with successful 40 lb wt loss in past 6 months. ____________________________________________________     Orders Placed This Encounter  Procedures   POCT HgB A1C    Meds ordered this encounter  Medications   ZEPBOUND  15 MG/0.5ML Pen    Sig: Inject 15 mg into the skin once a week.    Dispense:  6 mL    Refill:  1   hydrochlorothiazide  (HYDRODIURIL ) 25 MG tablet    Sig: Take 1 tablet (25 mg total) by mouth daily.    Dispense:  90 tablet    Refill:  3    Follow up plan: Return for 6 month fasting lab > 1 week later Annual Physical.   Marsa Officer, DO [facility] 04/23/2023, 3:20 PM

## 2023-04-23 NOTE — Patient Instructions (Addendum)
 Thank you for coming to the office today.  Zepbound  15mg  weekly re ordered today for 90 day We will try to request a new authorization, stay tuned. Otherwise, please keep checking with the pharmacy / insurance.  Goal BP < 135 / 85 If home readings look good Okay or permission to cut the hydrochlorothiazide  tab in HALF if interested for reduced dose. We can order 12.5mg   if interested at some point, with less medication now with weight loss  DUE for FASTING BLOOD WORK (no food or drink after midnight before the lab appointment, only water or coffee without cream/sugar on the morning of)  SCHEDULE Lab Only visit in the morning at the clinic for lab draw in 6 MONTHS   - Make sure Lab Only appointment is at about 1 week before your next appointment, so that results will be available  For Lab Results, once available within 2-3 days of blood draw, you can can log in to MyChart online to view your results and a brief explanation. Also, we can discuss results at next follow-up visit.   Please schedule a Follow-up Appointment to: Return for 6 month fasting lab > 1 week later Annual Physical.  If you have any other questions or concerns, please feel free to call the office or send a message through MyChart. You may also schedule an earlier appointment if necessary.  Additionally, you may be receiving a survey about your experience at our office within a few days to 1 week by e-mail or mail. We value your feedback.  Marsa Officer, DO [facility], [place]

## 2023-04-24 ENCOUNTER — Other Ambulatory Visit: Payer: Self-pay | Admitting: Family Medicine

## 2023-04-24 DIAGNOSIS — I10 Essential (primary) hypertension: Secondary | ICD-10-CM

## 2023-04-24 DIAGNOSIS — Z131 Encounter for screening for diabetes mellitus: Secondary | ICD-10-CM

## 2023-04-24 DIAGNOSIS — Z0000 Encounter for general adult medical examination without abnormal findings: Secondary | ICD-10-CM

## 2023-05-10 ENCOUNTER — Encounter: Payer: Self-pay | Admitting: Family Medicine

## 2023-08-09 ENCOUNTER — Other Ambulatory Visit: Payer: Self-pay | Admitting: Family Medicine

## 2023-08-09 DIAGNOSIS — I10 Essential (primary) hypertension: Secondary | ICD-10-CM

## 2023-08-09 NOTE — Telephone Encounter (Signed)
 Requested Prescriptions  Refused Prescriptions Disp Refills   hydrochlorothiazide  (HYDRODIURIL ) 25 MG tablet [Pharmacy Med Name: HYDROCHLOROTHIAZIDE  25MG  TABLETS] 90 tablet 3    Sig: TAKE 1 TABLET(25 MG) BY MOUTH DAILY     Cardiovascular: Diuretics - Thiazide Failed - 08/09/2023  4:59 PM      Failed - Cr in normal range and within 180 days    Creat  Date Value Ref Range Status  10/12/2022 0.87 0.60 - 1.29 mg/dL Final         Failed - K in normal range and within 180 days    Potassium  Date Value Ref Range Status  10/12/2022 3.9 3.5 - 5.3 mmol/L Final         Failed - Na in normal range and within 180 days    Sodium  Date Value Ref Range Status  10/12/2022 140 135 - 146 mmol/L Final  01/11/2020 136 (A) 137 - 147 Final         Failed - Valid encounter within last 6 months    Recent Outpatient Visits   None     Future Appointments             In 2 months Romeo Co, Kayleen Party, DO [place] [facility]   In 3 months Dustin Gimenez, MD [facility] [place]            Passed - Last BP in normal range    BP Readings from Last 1 Encounters:  04/23/23 130/80

## 2023-10-01 ENCOUNTER — Other Ambulatory Visit (HOSPITAL_COMMUNITY): Payer: Self-pay

## 2023-10-20 ENCOUNTER — Other Ambulatory Visit: Payer: Self-pay | Admitting: Urology

## 2023-10-20 DIAGNOSIS — E291 Testicular hypofunction: Secondary | ICD-10-CM

## 2023-10-25 ENCOUNTER — Other Ambulatory Visit: Payer: Self-pay

## 2023-10-25 DIAGNOSIS — I10 Essential (primary) hypertension: Secondary | ICD-10-CM

## 2023-10-25 DIAGNOSIS — Z131 Encounter for screening for diabetes mellitus: Secondary | ICD-10-CM

## 2023-10-25 DIAGNOSIS — Z0000 Encounter for general adult medical examination without abnormal findings: Secondary | ICD-10-CM

## 2023-10-27 ENCOUNTER — Other Ambulatory Visit: Payer: Self-pay

## 2023-10-27 DIAGNOSIS — Z131 Encounter for screening for diabetes mellitus: Secondary | ICD-10-CM

## 2023-10-27 DIAGNOSIS — Z0000 Encounter for general adult medical examination without abnormal findings: Secondary | ICD-10-CM

## 2023-10-28 ENCOUNTER — Other Ambulatory Visit

## 2023-10-28 DIAGNOSIS — Z0000 Encounter for general adult medical examination without abnormal findings: Secondary | ICD-10-CM | POA: Diagnosis not present

## 2023-10-28 DIAGNOSIS — Z131 Encounter for screening for diabetes mellitus: Secondary | ICD-10-CM | POA: Diagnosis not present

## 2023-10-28 DIAGNOSIS — R5383 Other fatigue: Secondary | ICD-10-CM | POA: Diagnosis not present

## 2023-10-29 LAB — COMPREHENSIVE METABOLIC PANEL WITH GFR
AG Ratio: 1.9 (calc) (ref 1.0–2.5)
ALT: 15 U/L (ref 9–46)
AST: 14 U/L (ref 10–40)
Albumin: 4.1 g/dL (ref 3.6–5.1)
Alkaline phosphatase (APISO): 40 U/L (ref 36–130)
BUN: 15 mg/dL (ref 7–25)
CO2: 24 mmol/L (ref 20–32)
Calcium: 8.7 mg/dL (ref 8.6–10.3)
Chloride: 107 mmol/L (ref 98–110)
Creat: 0.82 mg/dL (ref 0.60–1.29)
Globulin: 2.2 g/dL (ref 1.9–3.7)
Glucose, Bld: 86 mg/dL (ref 65–99)
Potassium: 4.1 mmol/L (ref 3.5–5.3)
Sodium: 140 mmol/L (ref 135–146)
Total Bilirubin: 0.5 mg/dL (ref 0.2–1.2)
Total Protein: 6.3 g/dL (ref 6.1–8.1)
eGFR: 113 mL/min/1.73m2 (ref >=60)

## 2023-10-29 LAB — HEMOGLOBIN A1C
Hgb A1c MFr Bld: 5.4 % (ref <5.7)
Mean Plasma Glucose: 108 mg/dL
eAG (mmol/L): 6 mmol/L

## 2023-10-29 LAB — LIPID PANEL
Cholesterol: 141 mg/dL (ref <200)
HDL: 38 mg/dL — ABNORMAL LOW (ref >=40)
LDL Cholesterol (Calc): 87 mg/dL
Non-HDL Cholesterol (Calc): 103 mg/dL (ref <130)
Total CHOL/HDL Ratio: 3.7 (calc) (ref <5.0)
Triglycerides: 70 mg/dL (ref <150)

## 2023-10-29 LAB — TSH: TSH: 2.17 m[IU]/L (ref 0.40–4.50)

## 2023-11-02 ENCOUNTER — Ambulatory Visit: Payer: Self-pay | Admitting: Family Medicine

## 2023-11-26 ENCOUNTER — Other Ambulatory Visit: Payer: Self-pay

## 2023-11-26 DIAGNOSIS — E291 Testicular hypofunction: Secondary | ICD-10-CM

## 2023-11-27 LAB — CBC
Hematocrit: 48.8 % (ref 37.5–51.0)
Hemoglobin: 15.9 g/dL (ref 13.0–17.7)
MCH: 27.9 pg (ref 26.6–33.0)
MCHC: 32.6 g/dL (ref 31.5–35.7)
MCV: 86 fL (ref 79–97)
Platelets: 264 x10E3/uL (ref 150–450)
RBC: 5.7 x10E6/uL (ref 4.14–5.80)
RDW: 13.7 % (ref 11.6–15.4)
WBC: 8.7 x10E3/uL (ref 3.4–10.8)

## 2023-11-27 LAB — HEPATIC FUNCTION PANEL
ALT: 21 IU/L (ref 0–44)
AST: 14 IU/L (ref 0–40)
Albumin: 4.6 g/dL (ref 4.1–5.1)
Alkaline Phosphatase: 51 IU/L (ref 44–121)
Bilirubin Total: 0.4 mg/dL (ref 0.0–1.2)
Bilirubin, Direct: 0.15 mg/dL (ref 0.00–0.40)
Total Protein: 7.2 g/dL (ref 6.0–8.5)

## 2023-11-27 LAB — TESTOSTERONE: Testosterone: 409 ng/dL (ref 264–916)

## 2023-11-27 LAB — PSA: Prostate Specific Ag, Serum: 0.9 ng/mL (ref 0.0–4.0)

## 2023-11-29 NOTE — Progress Notes (Signed)
 11/30/2023 3:36 PM   Mabel LELON Lunger [birth year] [id number]  Referring provider: Edman Marsa PARAS, DO [address] [place],  [place] [address]  Urological history: 1.  Hypogonadism - Testosterone level (11/2023) 409 - Hemoglobin/hematocrit (11/2023) 15.9/48.8 - hepatic panel (11/2023) WNL  - Clomid 50 mg, 1/2 tablet   2. BPH with LU TS - PSA (11/2023) 0.9  Chief Complaint  Patient presents with   Follow-up   HPI: LUCIANO CINQUEMANI is a 43 y.o. man who presents today for yearly follow-up.  Previous records reviewed.   He reports good energy.  He has no urinary complains.  Patient denies any modifying or aggravating factors.  Patient denies any recent UTI's, gross hematuria, dysuria or suprapubic/flank pain.  Patient denies any fevers, chills, nausea or vomiting.    He has satisfactory erections.  Patient still having spontaneous erections.  He denies any pain or curvature with erections.  No issues with ejaculation  Lab Results  Component Value Date   WBC 8.7 11/26/2023   HGB 15.9 11/26/2023   HCT 48.8 11/26/2023   MCV 86 11/26/2023   PLT 264 11/26/2023   Lab Results  Component Value Date   CREATININE 0.82 10/28/2023   Lab Results  Component Value Date   TESTOSTERONE 409 11/26/2023   Lab Results  Component Value Date   HGBA1C 5.4 10/28/2023   Lipid Panel     Component Value Date/Time   CHOL 141 10/28/2023 0826   TRIG 70 10/28/2023 0826   HDL 38 (L) 10/28/2023 0826   CHOLHDL 3.7 10/28/2023 0826   LDLCALC 87 10/28/2023 0826     PMH: Past Medical History:  Diagnosis Date   Sleep apnea     Surgical History: No past surgical history on file.  Home Medications:  Allergies as of 11/30/2023   No Known Allergies      Medication List        Accurate as of November 30, 2023  3:36 PM. If you have any questions, ask your nurse or doctor.          STOP taking these medications    Zepbound 15 MG/0.5ML Pen Generic drug:  tirzepatide Stopped by: Marsa Edman       TAKE these medications    clomiPHENE 50 MG tablet Commonly known as: Clomid Take 1 tablet (50 mg total) by mouth daily. Take 1/2 (one-half) tablet by mouth once daily What changed: See the new instructions. Changed by: CLOTILDA CORNWALL   hydrochlorothiazide 12.5 MG tablet Commonly known as: HYDRODIURIL Take 1 tablet (12.5 mg total) by mouth daily. What changed:  medication strength how much to take Changed by: Marsa Edman        Allergies: No Known Allergies  Family History: Family History  Problem Relation Age of Onset   Hypertension Father    Heart disease Maternal Grandmother    Heart disease Maternal Grandfather    Kidney failure Maternal Grandfather    Cancer Maternal Grandfather    Hypertension Paternal Grandmother    Prostate cancer Neg Hx    Colon cancer Neg Hx    Breast cancer Neg Hx     Social History: See HPI for pertinent social history  ROS: Pertinent ROS in HPI  Physical Exam: BP 133/88   Pulse 66   Ht 5' 9 (1.753 m)   Wt 288 lb (130.6 kg)   SpO2 97%   BMI 42.53 kg/m   Constitutional:  Well nourished. Alert and oriented, No acute distress. HEENT:  House AT, moist mucus membranes.  Trachea midline Cardiovascular: No clubbing, cyanosis, or edema. Respiratory: Normal respiratory effort, no increased work of breathing. Neurologic: Grossly intact, no focal deficits, moving all 4 extremities. Psychiatric: Normal mood and affect.  Laboratory Data: See EPIC and HPI  I have reviewed the labs.   Pertinent Imaging: N/A Assessment & Plan:    1. Hypogonadism  -testosterone levels are therapeutic -H & H WNL -hepatic panel WNL -continue Clomid 50 mg, 1/2 tablet daily   2.  Screening PSA -PSA stable    Return in about 1 year (around 11/29/2024) for Testosterone, hepatic panel, hemoglobin, hematocrit, PSA and office visit.  These notes generated with voice recognition software. I  apologize for typographical errors.  CLOTILDA HELON RIGGERS  [facility] [address]  [address] [place], [place] [address] [phone]

## 2023-11-30 ENCOUNTER — Ambulatory Visit (INDEPENDENT_AMBULATORY_CARE_PROVIDER_SITE_OTHER): Admitting: Urology

## 2023-11-30 ENCOUNTER — Encounter: Payer: Self-pay | Admitting: Family Medicine

## 2023-11-30 ENCOUNTER — Ambulatory Visit: Admitting: Family Medicine

## 2023-11-30 ENCOUNTER — Ambulatory Visit: Payer: Self-pay | Admitting: Urology

## 2023-11-30 ENCOUNTER — Other Ambulatory Visit: Payer: Self-pay

## 2023-11-30 ENCOUNTER — Other Ambulatory Visit: Payer: Self-pay | Admitting: Family Medicine

## 2023-11-30 ENCOUNTER — Other Ambulatory Visit: Payer: Self-pay | Admitting: Urology

## 2023-11-30 ENCOUNTER — Other Ambulatory Visit: Payer: Self-pay | Admitting: Medical Genetics

## 2023-11-30 ENCOUNTER — Encounter: Payer: Self-pay | Admitting: Urology

## 2023-11-30 VITALS — BP 133/88 | HR 66 | Ht 69.0 in | Wt 288.0 lb

## 2023-11-30 VITALS — BP 130/84 | HR 78 | Ht 69.0 in | Wt 289.4 lb

## 2023-11-30 DIAGNOSIS — Z6841 Body Mass Index (BMI) 40.0 and over, adult: Secondary | ICD-10-CM

## 2023-11-30 DIAGNOSIS — Z0000 Encounter for general adult medical examination without abnormal findings: Secondary | ICD-10-CM | POA: Diagnosis not present

## 2023-11-30 DIAGNOSIS — E291 Testicular hypofunction: Secondary | ICD-10-CM

## 2023-11-30 DIAGNOSIS — G4733 Obstructive sleep apnea (adult) (pediatric): Secondary | ICD-10-CM

## 2023-11-30 DIAGNOSIS — E786 Lipoprotein deficiency: Secondary | ICD-10-CM

## 2023-11-30 DIAGNOSIS — N521 Erectile dysfunction due to diseases classified elsewhere: Secondary | ICD-10-CM

## 2023-11-30 DIAGNOSIS — R7309 Other abnormal glucose: Secondary | ICD-10-CM

## 2023-11-30 DIAGNOSIS — I10 Essential (primary) hypertension: Secondary | ICD-10-CM | POA: Diagnosis not present

## 2023-11-30 DIAGNOSIS — N138 Other obstructive and reflux uropathy: Secondary | ICD-10-CM

## 2023-11-30 MED ORDER — CLOMIPHENE CITRATE 50 MG PO TABS: ORAL_TABLET | ORAL | 3 refills | Status: DC

## 2023-11-30 MED ORDER — CLOMIPHENE CITRATE 50 MG PO TABS: 50.0000 mg | ORAL_TABLET | Freq: Every day | ORAL | 3 refills | Status: DC

## 2023-11-30 MED ORDER — HYDROCHLOROTHIAZIDE 12.5 MG PO TABS: 12.5000 mg | ORAL_TABLET | Freq: Every day | ORAL | 3 refills | Status: AC

## 2023-11-30 NOTE — Patient Instructions (Addendum)
 Thank you for coming to the office today.  Reduce dose on hydrochlorothiazide  to 12.5mg  daily, okay to pause or stop in the future if no longer needed.  Good labs results.  Consider options for weight management  [email or web address]  Consider this for helping to jump start the diet / overhaul improvement.  We can consider a referral through this weight clinic. Intro programs / info sessions  [facility] [address] [place] [address] [place], [place] [address] Ph: [phone]    Please schedule a Follow-up Appointment to: No follow-ups on file.  If you have any other questions or concerns, please feel free to call the office or send a message through MyChart. You may also schedule an earlier appointment if necessary.  Additionally, you may be receiving a survey about your experience at our office within a few days to 1 week by e-mail or mail. We value your feedback.  Marsa Officer, DO [facility], [place]

## 2023-11-30 NOTE — Progress Notes (Signed)
 Subjective:    Patient ID: Alan Duncan, male    DOB: [birth year], 43 y.o.   MRN: [id number]  Alan Duncan is a 43 y.o. male presenting on 11/30/2023 for Annual Exam   HPI  Discussed the use of AI scribe software for clinical note transcription with the patient, who gave verbal consent to proceed.  History of Present Illness   Alan Duncan is a 43 year old male who presents for an annual physical exam.  Obesity and weight management BMI >42 - Current weight is 289 pounds, down from 342 pounds in August of last year - Home scale shows 287 pounds - Weight loss attributed to use of Zepbound  until December, improved diet, and regular exercise (walking almost every evening) - Off Zepbound  since December due to insurance issues but continues to lose weight - Goal weight is 250 pounds  Hypertension Well controlled - Blood pressure managed with medication. Hydrochlorothiazide  25mg  daily - Initially took full dose, reduced to half dose since February - Off medication for two weeks due to refill issue - Blood pressure readings remain stable off med - Off med today  Obstructive sleep apnea - Today reports that sleep apnea is well controlled. He uses the CPAP machine every night. Tolerates the machine well, and thinks that sleeps better with it and feels good. No new concerns or symptoms. - Improvement in symptoms with fewer headaches and overall improved well-being - Believes further weight loss may allow discontinuation of CPAP  Glycemic control - A1c currently 5.4, improved from 5.7 in June 2024  Dyslipidemia - Total cholesterol 140 - LDL 87 - HDL improved from 30 to 40  Hypogonadism Followed by Urology - Testosterone  level 409 - Currently taking Clomid  - Not on testosterone  injections - PSA 0.9           11/30/2023   11:07 AM 04/23/2023    2:44 PM 10/19/2022    3:27 PM  Depression screen PHQ 2/9  Decreased Interest 0 0 0  Down, Depressed, Hopeless 0 0 0   PHQ - 2 Score 0 0 0  Altered sleeping 2  0  Tired, decreased energy 0  0  Change in appetite 0  0  Feeling bad or failure about yourself  0  0  Trouble concentrating 0  0  Moving slowly or fidgety/restless 0  0  Suicidal thoughts 0  0  PHQ-9 Score 2  0  Difficult doing work/chores Not difficult at all  Not difficult at all       11/30/2023   11:07 AM 04/23/2023    2:44 PM 10/19/2022    3:27 PM 05/07/2021    8:56 AM  GAD 7 : Generalized Anxiety Score  Nervous, Anxious, on Edge 0 0 0 0  Control/stop worrying 0 0 0 0  Worry too much - different things 0 0 0 0  Trouble relaxing 0 0 0 0  Restless 0 0 0 0  Easily annoyed or irritable 0 0 0 0  Afraid - awful might happen 0 0 0 0  Total GAD 7 Score 0 0 0 0  Anxiety Difficulty   Not difficult at all Not difficult at all     Past Medical History:  Diagnosis Date   Sleep apnea    History reviewed. No pertinent surgical history. Social History   Socioeconomic History   Marital status: Married    Spouse name: Not on file   Number of children: Not on file  Years of education: Not on file   Highest education level: Bachelor's degree (e.g., BA, AB, BS)  Occupational History   Not on file  Tobacco Use   Smoking status: Never    Passive exposure: Never   Smokeless tobacco: Never  Vaping Use   Vaping status: Never Used  Substance and Sexual Activity   Alcohol use: Yes    Comment: social   Drug use: No   Sexual activity: Not on file  Other Topics Concern   Not on file  Social History Narrative   Not on file   Social Drivers of Health   Financial Resource Strain: Low Risk  (11/23/2023)   Overall Financial Resource Strain (CARDIA)    Difficulty of Paying Living Expenses: Not hard at all  Food Insecurity: No Food Insecurity (11/23/2023)   Hunger Vital Sign    Worried About Running Out of Food in the Last Year: Never true    Ran Out of Food in the Last Year: Never true  Transportation Needs: No Transportation Needs (11/23/2023)    PRAPARE - Administrator, Civil Service (Medical): No    Lack of Transportation (Non-Medical): No  Physical Activity: Sufficiently Active (11/23/2023)   Exercise Vital Sign    Days of Exercise per Week: 4 days    Minutes of Exercise per Session: 40 min  Stress: No Stress Concern Present (11/23/2023)   [employer] of Occupational Health - Occupational Stress Questionnaire    Feeling of Stress: Only a little  Social Connections: Moderately Integrated (11/23/2023)   Social Connection and Isolation Panel    Frequency of Communication with Friends and Family: More than three times a week    Frequency of Social Gatherings with Friends and Family: Twice a week    Attends Religious Services: Never    Database administrator or Organizations: Yes    Attends Engineer, structural: More than 4 times per year    Marital Status: Married  Catering manager Violence: Not on file   Family History  Problem Relation Age of Onset   Hypertension Father    Heart disease Maternal Grandmother    Heart disease Maternal Grandfather    Kidney failure Maternal Grandfather    Cancer Maternal Grandfather    Hypertension Paternal Grandmother    Prostate cancer Neg Hx    Colon cancer Neg Hx    Breast cancer Neg Hx    No current outpatient medications on file prior to visit.   No current facility-administered medications on file prior to visit.    Review of Systems  Constitutional:  Negative for activity change, appetite change, chills, diaphoresis, fatigue and fever.  HENT:  Negative for congestion and hearing loss.   Eyes:  Negative for visual disturbance.  Respiratory:  Negative for cough, chest tightness, shortness of breath and wheezing.   Cardiovascular:  Negative for chest pain, palpitations and leg swelling.  Gastrointestinal:  Negative for abdominal pain, constipation, diarrhea, nausea and vomiting.  Genitourinary:  Negative for dysuria, frequency and hematuria.   Musculoskeletal:  Negative for arthralgias and neck pain.  Skin:  Negative for rash.  Neurological:  Negative for dizziness, weakness, light-headedness, numbness and headaches.  Hematological:  Negative for adenopathy.  Psychiatric/Behavioral:  Negative for behavioral problems, dysphoric mood and sleep disturbance.    Per HPI unless specifically indicated above     Objective:    BP 130/84 (BP Location: Left Arm, Patient Position: Sitting, Cuff Size: Large)   Pulse 78  Ht 5' 9 (1.753 m)   Wt 289 lb 6 oz (131.3 kg)   SpO2 97%   BMI 42.73 kg/m   Wt Readings from Last 3 Encounters:  11/30/23 288 lb (130.6 kg)  11/30/23 289 lb 6 oz (131.3 kg)  04/23/23 (!) 306 lb (138.8 kg)    Physical Exam Vitals and nursing note reviewed.  Constitutional:      General: He is not in acute distress.    Appearance: He is well-developed. He is obese. He is not diaphoretic.     Comments: Well-appearing, comfortable, cooperative  HENT:     Head: Normocephalic and atraumatic.  Eyes:     General:        Right eye: No discharge.        Left eye: No discharge.     Conjunctiva/sclera: Conjunctivae normal.     Pupils: Pupils are equal, round, and reactive to light.  Neck:     Thyroid : No thyromegaly.     Vascular: No carotid bruit.  Cardiovascular:     Rate and Rhythm: Normal rate and regular rhythm.     Pulses: Normal pulses.     Heart sounds: Normal heart sounds. No murmur heard. Pulmonary:     Effort: Pulmonary effort is normal. No respiratory distress.     Breath sounds: Normal breath sounds. No wheezing or rales.  Abdominal:     General: Bowel sounds are normal. There is no distension.     Palpations: Abdomen is soft. There is no mass.     Tenderness: There is no abdominal tenderness.  Musculoskeletal:        General: No tenderness. Normal range of motion.     Cervical back: Normal range of motion and neck supple.     Right lower leg: No edema.     Left lower leg: No edema.      Comments: Upper / Lower Extremities: - Normal muscle tone, strength bilateral upper extremities 5/5, lower extremities 5/5  Lymphadenopathy:     Cervical: No cervical adenopathy.  Skin:    General: Skin is warm and dry.     Findings: No erythema or rash.  Neurological:     Mental Status: He is alert and oriented to person, place, and time.     Comments: Distal sensation intact to light touch all extremities  Psychiatric:        Mood and Affect: Mood normal.        Behavior: Behavior normal.        Thought Content: Thought content normal.     Comments: Well groomed, good eye contact, normal speech and thoughts     Results for orders placed or performed in visit on 11/26/23  PSA   Collection Time: 11/26/23 10:54 AM  Result Value Ref Range   Prostate Specific Ag, Serum 0.9 0.0 - 4.0 ng/mL  Testosterone    Collection Time: 11/26/23 10:54 AM  Result Value Ref Range   Testosterone  409 264 - 916 ng/dL  Hepatic function panel   Collection Time: 11/26/23 10:54 AM  Result Value Ref Range   Total Protein 7.2 6.0 - 8.5 g/dL   Albumin 4.6 4.1 - 5.1 g/dL   Bilirubin Total 0.4 0.0 - 1.2 mg/dL   Bilirubin, Direct 9.84 0.00 - 0.40 mg/dL   Alkaline Phosphatase 51 44 - 121 IU/L   AST 14 0 - 40 IU/L   ALT 21 0 - 44 IU/L  CBC   Collection Time: 11/26/23 10:54 AM  Result Value Ref  Range   WBC 8.7 3.4 - 10.8 x10E3/uL   RBC 5.70 4.14 - 5.80 x10E6/uL   Hemoglobin 15.9 13.0 - 17.7 g/dL   Hematocrit 51.1 62.4 - 51.0 %   MCV 86 79 - 97 fL   MCH 27.9 26.6 - 33.0 pg   MCHC 32.6 31.5 - 35.7 g/dL   RDW 86.2 88.3 - 84.5 %   Platelets 264 150 - 450 x10E3/uL      Assessment & Plan:   Problem List Items Addressed This Visit     Essential hypertension   Relevant Medications   hydrochlorothiazide  (HYDRODIURIL ) 12.5 MG tablet   Other Relevant Orders   CT CARDIAC SCORING (SELF PAY ONLY)   Morbid obesity with BMI of 40.0-44.9, adult (HCC)   Relevant Orders   CT CARDIAC SCORING (SELF PAY ONLY)    OSA on CPAP   Other Visit Diagnoses       Annual physical exam    -  Primary        Updated Health Maintenance information Reviewed recent lab results with patient Encouraged improvement to lifestyle with diet and exercise Goal of weight loss   Adult Wellness Visit Annual wellness visit with normal lab results. Blood pressure 130/84. Discussed weight loss progress and strategies. - Order heart scan to assess coronary artery status. - Schedule follow-up appointment in one year.  Morbid Obesity BMI >42 Obesity management with 50-pound weight loss over the past year. Discontinued Zepbound  due to insurance issues. Current weight 289 pounds, goal 250 pounds. Discussed weight loss plateau and strategies. - Contact insurance to explore Zepbound  coverage for sleep apnea. - Consider a new sleep study if pursuing Zepbound  for sleep apnea. - Consider using the [employer] app for telehealth nutritionist support. - Consider oral Contrave for appetite suppression and weight management. - Consider referral to [facility] for comprehensive weight management.  Obstructive Sleep Apnea Obstructive sleep apnea managed with CPAP and symptom improvement.  Improved with his weight loss, less reliance on CPAP therapy but he still adheres to it Discussed potential insurance coverage for Zepbound  if linked to sleep apnea treatment.  Essential Hypertension Hypertension well-controlled through lifestyle changes and weight loss. Previously on 25 mg HCTZ, reduced to 12.5 mg, and off medication for two weeks with stable readings. Discussed potential discontinuation of medication if blood pressure remains well-controlled. Reduce dose officially - Prescribe HCTZ 12.5 mg daily with a 90-day supply. - Monitor blood pressure and consider discontinuing medication if well-controlled.  General Health Maintenance General health maintenance with no current need for flu vaccination. Discussed upcoming need for  colon cancer screening at age 6. - Obtain vaccine record to update medical records.        Orders Placed This Encounter  Procedures   CT CARDIAC SCORING (SELF PAY ONLY)    Standing Status:   Future    Expiration Date:   11/29/2024    Preferred imaging location?:   [facility]    Meds ordered this encounter  Medications   hydrochlorothiazide  (HYDRODIURIL ) 12.5 MG tablet    Sig: Take 1 tablet (12.5 mg total) by mouth daily.    Dispense:  90 tablet    Refill:  3     Follow up plan: Return for 1 year fasting lab > 1 week later Annual Physical.  Future labs 11/29/2024   Marsa Officer, DO [facility] 11/30/2023, 11:31 AM

## 2023-12-04 ENCOUNTER — Other Ambulatory Visit
Admission: RE | Admit: 2023-12-04 | Discharge: 2023-12-04 | Disposition: A | Discharge status: Home / Self Care | Payer: Self-pay | Source: Ambulatory Visit | Attending: Medical Genetics | Admitting: Medical Genetics

## 2023-12-08 ENCOUNTER — Ambulatory Visit
Admission: RE | Admit: 2023-12-08 | Discharge: 2023-12-08 | Disposition: A | Discharge status: Home / Self Care | Payer: Self-pay | Source: Ambulatory Visit | Attending: Family Medicine | Admitting: Family Medicine

## 2023-12-08 DIAGNOSIS — I10 Essential (primary) hypertension: Secondary | ICD-10-CM | POA: Insufficient documentation

## 2023-12-08 DIAGNOSIS — Z6841 Body Mass Index (BMI) 40.0 and over, adult: Secondary | ICD-10-CM | POA: Insufficient documentation

## 2023-12-09 ENCOUNTER — Ambulatory Visit: Payer: Self-pay | Admitting: Family Medicine

## 2023-12-13 LAB — GENECONNECT MOLECULAR SCREEN: Genetic Analysis Overall Interpretation: NEGATIVE

## 2024-03-21 ENCOUNTER — Other Ambulatory Visit: Payer: Self-pay | Admitting: Urology

## 2024-03-21 DIAGNOSIS — E291 Testicular hypofunction: Secondary | ICD-10-CM

## 2024-04-21 ENCOUNTER — Other Ambulatory Visit: Payer: Self-pay

## 2024-04-21 DIAGNOSIS — E291 Testicular hypofunction: Secondary | ICD-10-CM

## 2024-04-21 MED ORDER — CLOMIPHENE CITRATE 50 MG PO TABS: ORAL_TABLET | ORAL | 7 refills | Status: AC

## 2024-11-27 ENCOUNTER — Ambulatory Visit: Admitting: Urology

## 2024-11-29 ENCOUNTER — Other Ambulatory Visit

## 2024-12-06 ENCOUNTER — Encounter: Admitting: Family Medicine
